# Patient Record
Sex: Male | Born: 1970 | Race: White | Hispanic: No | Marital: Married | Smoking: Former smoker
Health system: Southern US, Community
[De-identification: ages and names within clinical notes are randomized; demographics above are authoritative.]

## PROBLEM LIST (undated history)

## (undated) DIAGNOSIS — F419 Anxiety disorder, unspecified: Secondary | ICD-10-CM

## (undated) HISTORY — PX: HERNIA REPAIR: SHX51

## (undated) HISTORY — DX: Anxiety disorder, unspecified: F41.9

---

## 2015-11-29 ENCOUNTER — Emergency Department
Admission: EM | Admit: 2015-11-29 | Discharge: 2015-11-29 | Disposition: A | Discharge status: Home / Self Care | Payer: Self-pay | Attending: Emergency Medicine | Admitting: Emergency Medicine

## 2015-11-29 DIAGNOSIS — Y9289 Other specified places as the place of occurrence of the external cause: Secondary | ICD-10-CM | POA: Insufficient documentation

## 2015-11-29 DIAGNOSIS — S0502XA Injury of conjunctiva and corneal abrasion without foreign body, left eye, initial encounter: Secondary | ICD-10-CM | POA: Insufficient documentation

## 2015-11-29 DIAGNOSIS — X58XXXA Exposure to other specified factors, initial encounter: Secondary | ICD-10-CM | POA: Insufficient documentation

## 2015-11-29 DIAGNOSIS — Y9389 Activity, other specified: Secondary | ICD-10-CM | POA: Insufficient documentation

## 2015-11-29 DIAGNOSIS — Y990 Civilian activity done for income or pay: Secondary | ICD-10-CM | POA: Insufficient documentation

## 2015-11-29 DIAGNOSIS — Z87891 Personal history of nicotine dependence: Secondary | ICD-10-CM | POA: Insufficient documentation

## 2015-11-29 MED ORDER — EYE WASH OPHTH SOLN
OPHTHALMIC | Status: AC
  Filled 2015-11-29: qty 118

## 2015-11-29 MED ORDER — GENTAMICIN SULFATE 0.3 % OP SOLN: 1.0000 [drp] | OPHTHALMIC | Status: DC

## 2015-11-29 MED ORDER — FLUORESCEIN SODIUM 1 MG OP STRP
ORAL_STRIP | OPHTHALMIC | Status: AC
  Filled 2015-11-29: qty 1

## 2015-11-29 MED ORDER — TETRACAINE HCL 0.5 % OP SOLN
OPHTHALMIC | Status: AC
  Filled 2015-11-29: qty 2

## 2015-11-29 NOTE — Discharge Instructions (Signed)
Corneal Abrasion °The cornea is the clear covering at the front and center of the eye. When you look at the colored portion of the eye, you are looking through the cornea. It is a thin tissue made up of layers. The top layer is the most sensitive layer. A corneal abrasion happens if this layer is scratched or an injury causes it to come off.  °HOME CARE °· You may be given drops or a medicated cream. Use the medicine as told by your doctor. °· A pressure patch may be put over the eye. If this is done, follow your doctor's instructions for when to remove the patch. Do not drive or use machines while the eye patch is on. Judging distances is hard to do with a patch on. °· See your doctor for a follow-up exam if you are told to do so. It is very important that you keep this appointment. °GET HELP IF:  °· You have pain, are sensitive to light, and have a scratchy feeling in one eye or both eyes. °· Your pressure patch keeps getting loose. You can blink your eye under the patch. °· You have fluid coming from your eye or the lids stick together in the morning. °· You have the same symptoms in the morning that you did with the first abrasion. This could be days, weeks, or months after the first abrasion healed. °  °This information is not intended to replace advice given to you by your health care provider. Make sure you discuss any questions you have with your health care provider. °  °Document Released: 02/22/2008 Document Revised: 05/27/2015 Document Reviewed: 05/13/2013 °Elsevier Interactive Patient Education ©2016 [employer] ° °

## 2015-11-29 NOTE — ED Notes (Signed)
Visual acuity no corrective eyewear.  Left eye-20/30, right eye 20/25 Both 20/20

## 2015-11-29 NOTE — ED Notes (Signed)
Pt presents with c/o foreign body in left eye.  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

## 2015-11-29 NOTE — ED Notes (Signed)
Pt denies vision impairment

## 2015-11-29 NOTE — ED Provider Notes (Signed)
[facility] Emergency Department Provider Note  ____________________________________________  Time seen: Approximately 5:17 PM  I have reviewed the triage vital signs and the nursing notes.   HISTORY  Chief Complaint Foreign Body in Eye    HPI Cole Rose is a 45 y.o. male patient complaining of foreign body sensation left eye. Onset yesterday while working in his workshop. Patient state foreign body sensation which has not improved in the last 24 hours. Patient denies any vision disturbance. Patient states she tried to flush the eye at home and does not go to successfully remove the foreign body. Patient rates his pain as a 5/10.   History reviewed. No pertinent past medical history.  There are no active problems to display for this patient.   Past Surgical History  Procedure Laterality Date  . Hernia repair Right     inguinal    No current outpatient prescriptions on file.  Allergies Review of patient's allergies indicates no known allergies.  No family history on file.  Social History Social History  Substance Use Topics  . Smoking status: Former Games developermoker  . Smokeless tobacco: None  . Alcohol Use: None    Review of Systems Constitutional: No fever/chills Eyes: No visual changes. Foreign body sensation left eye ENT: No sore throat. Cardiovascular: Denies chest pain. Respiratory: Denies shortness of breath. Gastrointestinal: No abdominal pain.  No nausea, no vomiting.  No diarrhea.  No constipation. Genitourinary: Negative for dysuria. Musculoskeletal: Negative for back pain. Skin: Negative for rash. Neurological: Negative for headaches, focal weakness or numbness.  10-point ROS otherwise negative.  ____________________________________________   PHYSICAL EXAM:  VITAL SIGNS: ED Triage Vitals  Enc Vitals Group     BP 11/29/15 1706 143/75 mmHg     Pulse Rate 11/29/15 1706 90     Resp 11/29/15 1706 16     Temp 11/29/15 1706 98.2  F (36.8 C)     Temp Source 11/29/15 1706 Oral     SpO2 11/29/15 1706 100 %     Weight 11/29/15 1706 200 lb (90.719 kg)     Height --      Head Cir --      Peak Flow --      Pain Score --      Pain Loc --      Pain Edu? --      Excl. in GC? --     Constitutional: Alert and oriented. Well appearing and in no acute distress. Eyes: Conjunctivae are normal. PERRL. EOMI. fluoroscopy stain uptake reveals cornea abrasion. Head: Atraumatic. Nose: No congestion/rhinnorhea. Mouth/Throat: Mucous membranes are moist.  Oropharynx non-erythematous. Neck: No stridor.  No cervical spine tenderness to palpation. Hematological/Lymphatic/Immunilogical: No cervical lymphadenopathy. Cardiovascular: Normal rate, regular rhythm. Grossly normal heart sounds.  Good peripheral circulation. Respiratory: Normal respiratory effort.  No retractions. Lungs CTAB. Gastrointestinal: Soft and nontender. No distention. No abdominal bruits. No CVA tenderness. Musculoskeletal: No lower extremity tenderness nor edema.  No joint effusions. Neurologic:  Normal speech and language. No gross focal neurologic deficits are appreciated. No gait instability. Skin:  Skin is warm, dry and intact. No rash noted. Psychiatric: Mood and affect are normal. Speech and behavior are normal.  ____________________________________________   LABS (all labs ordered are listed, but only abnormal results are displayed)  Labs Reviewed - No data to display ____________________________________________  EKG   ____________________________________________  RADIOLOGY   ____________________________________________   PROCEDURES  Procedure(s) performed: None  Critical Care performed: No  ____________________________________________   INITIAL IMPRESSION /  ASSESSMENT AND PLAN / ED COURSE  Pertinent labs & imaging results that were available during my care of the patient were reviewed by me and considered in my medical decision  making (see chart for details).  Cornea abrasion left eye. Discussed discharge instructions with patient. Patient given a prescription for Garamycin eyedrops. She advised to follow-up with [facility] if no improvement in 2 days. Return back to ER if condition worsens. ____________________________________________   FINAL CLINICAL IMPRESSION(S) / ED DIAGNOSES  Final diagnoses:  Injury of conjunctiva and corneal abrasion of left eye w/o FB, initial encounter       Joni Reining, PA-C 11/29/15 1731  Emily Filbert, MD 11/29/15 1932

## 2019-11-21 ENCOUNTER — Encounter (HOSPITAL_COMMUNITY): Payer: Self-pay | Admitting: Emergency Medicine

## 2019-11-21 ENCOUNTER — Emergency Department (HOSPITAL_COMMUNITY)
Admission: EM | Admit: 2019-11-21 | Discharge: 2019-11-21 | Disposition: A | Discharge status: Home / Self Care | Payer: 59 | Attending: Emergency Medicine | Admitting: Emergency Medicine

## 2019-11-21 ENCOUNTER — Other Ambulatory Visit: Payer: Self-pay

## 2019-11-21 DIAGNOSIS — Z87891 Personal history of nicotine dependence: Secondary | ICD-10-CM | POA: Insufficient documentation

## 2019-11-21 DIAGNOSIS — R55 Syncope and collapse: Secondary | ICD-10-CM

## 2019-11-21 LAB — BASIC METABOLIC PANEL
Anion gap: 7 (ref 5–15)
BUN: 16 mg/dL (ref 6–20)
CO2: 25 mmol/L (ref 22–32)
Calcium: 9.2 mg/dL (ref 8.9–10.3)
Chloride: 104 mmol/L (ref 98–111)
Creatinine, Ser: 0.93 mg/dL (ref 0.61–1.24)
GFR calc Af Amer: >60 mL/min (ref >60)
GFR calc non Af Amer: >60 mL/min (ref >60)
Glucose, Bld: 104 mg/dL — ABNORMAL HIGH (ref 70–99)
Potassium: 3.8 mmol/L (ref 3.5–5.1)
Sodium: 136 mmol/L (ref 135–145)

## 2019-11-21 LAB — CBC
HCT: 48.4 % (ref 39.0–52.0)
Hemoglobin: 15.9 g/dL (ref 13.0–17.0)
MCH: 29.4 pg (ref 26.0–34.0)
MCHC: 32.9 g/dL (ref 30.0–36.0)
MCV: 89.5 fL (ref 80.0–100.0)
Platelets: 257 10*3/uL (ref 150–400)
RBC: 5.41 MIL/uL (ref 4.22–5.81)
RDW: 12.6 % (ref 11.5–15.5)
WBC: 11.4 10*3/uL — ABNORMAL HIGH (ref 4.0–10.5)
nRBC: 0 % (ref 0.0–0.2)

## 2019-11-21 LAB — URINALYSIS, ROUTINE W REFLEX MICROSCOPIC
Bilirubin Urine: NEGATIVE
Glucose, UA: NEGATIVE mg/dL
Hgb urine dipstick: NEGATIVE
Ketones, ur: NEGATIVE mg/dL
Leukocytes,Ua: NEGATIVE
Nitrite: NEGATIVE
Protein, ur: NEGATIVE mg/dL
Specific Gravity, Urine: 1.023 (ref 1.005–1.030)
pH: 5 (ref 5.0–8.0)

## 2019-11-21 LAB — TROPONIN I (HIGH SENSITIVITY): Troponin I (High Sensitivity): <2 ng/L (ref <18)

## 2019-11-21 LAB — CBG MONITORING, ED: Glucose-Capillary: 121 mg/dL — ABNORMAL HIGH (ref 70–99)

## 2019-11-21 NOTE — ED Triage Notes (Signed)
Patient was at work running heavy machinery and felt like he had two episode of near syncope without LOC.

## 2019-11-21 NOTE — ED Provider Notes (Signed)
[facility] EMERGENCY DEPARTMENT Provider Note   CSN: [id number] Arrival date & time: 11/21/19  1529     History Chief Complaint  Patient presents with  . Near Syncope    Cole Rose is a 49 y.o. male with a history of right inguinal hernia repair who presents to the emergency department with complaints of 2 episodes of feeling as though he may pass out this afternoon.  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  He returned to work and had a recurrent episode of the same type of symptoms, again relieved with getting out side and getting some fresh air.  No other alleviating or aggravating factors.  He did not syncopized.  He had no associated chest pain, dyspnea, emesis, headache, visual disturbance, numbness, or weakness. Denies leg pain/swelling, hemoptysis, recent surgery/trauma, recent long travel, hormone use, personal hx of cancer, or hx of DVT/PE.  He has not had similar symptoms in the past.  He denies issues with dehydration or feeling poorly otherwise throughout the day.  He states he feels back to baseline currently.   HPI     History reviewed. No pertinent past medical history.  There are no problems to display for this patient.   Past Surgical History:  Procedure Laterality Date  . HERNIA REPAIR Right    inguinal       History reviewed. No pertinent family history.  Social History   Tobacco Use  . Smoking status: Former Games developer  . Smokeless tobacco: Never Used  Substance Use Topics  . Alcohol use: Yes    Alcohol/week: 7.0 standard drinks    Types: 7 Shots of liquor per week    Comment:  a week  . Drug use: Not on file    Home Medications Prior to Admission medications   Medication Sig Start Date End  Date Taking? Authorizing Provider  gentamicin (GARAMYCIN) 0.3 % ophthalmic solution Place 1 drop into the left eye every 4 (four) hours. 11/29/15   Joni Reining, PA-C    Allergies    Patient has no known allergies.  Review of Systems   Review of Systems  Constitutional: Negative for chills and fever.  Eyes: Negative for visual disturbance.  Respiratory: Negative for shortness of breath.   Cardiovascular: Negative for chest pain and leg swelling.  Gastrointestinal: Negative for abdominal pain, nausea and vomiting.       Positive for "flutter in the abdomen", resolved at present.  Neurological: Positive for light-headedness (Resolved at present.). Negative for dizziness, seizures, syncope, facial asymmetry, speech difficulty, weakness, numbness and headaches.  All other systems reviewed and are negative.   Physical Exam Updated Vital Signs BP (!) 158/93 (BP Location: Right Arm)   Pulse 83   Temp 98.9 F (37.2 C) (Oral)   Resp 16   Ht 5\' 10"  (1.778 m)   Wt 93 kg   SpO2 100%   BMI 29.41 kg/m   Physical Exam Vitals and nursing note reviewed.  Constitutional:      General: He is not in acute distress.    Appearance: He is well-developed. He is not toxic-appearing.  HENT:     Head: Normocephalic and atraumatic.  Eyes:     General:  Right eye: No discharge.        Left eye: No discharge.     Conjunctiva/sclera: Conjunctivae normal.  Neck:     Vascular: No carotid bruit.  Cardiovascular:     Rate and Rhythm: Normal rate and regular rhythm.     Heart sounds: No murmur.  Pulmonary:     Effort: Pulmonary effort is normal. No respiratory distress.     Breath sounds: Normal breath sounds. No wheezing, rhonchi or rales.  Abdominal:     General: There is no distension.     Palpations: Abdomen is soft.     Tenderness: There is no abdominal tenderness. There is no guarding or rebound.  Musculoskeletal:     Cervical back: Neck supple.     Right lower leg: No edema.      Left lower leg: No edema.  Skin:    General: Skin is warm and dry.     Findings: No rash.  Neurological:     Mental Status: He is alert.     Comments: Clear speech.  CN II through XII grossly intact.  Sensation grossly intact bilateral upper and lower extremities.  5 out of 5 symmetric grip strength.  5 department the plantar dorsiflexion bilaterally.  Normal finger-to-nose.  Negative pronator drift.  Ambulatory with steady gait.  Psychiatric:        Behavior: Behavior normal.     ED Results / Procedures / Treatments   Labs (all labs ordered are listed, but only abnormal results are displayed) Labs Reviewed  BASIC METABOLIC PANEL - Abnormal; Notable for the following components:      Result Value   Glucose, Bld 104 (*)    All other components within normal limits  CBC - Abnormal; Notable for the following components:   WBC 11.4 (*)    All other components within normal limits  CBG MONITORING, ED - Abnormal; Notable for the following components:   Glucose-Capillary 121 (*)    All other components within normal limits  URINALYSIS, ROUTINE W REFLEX MICROSCOPIC  TROPONIN I (HIGH SENSITIVITY)  TROPONIN I (HIGH SENSITIVITY)    EKG EKG Interpretation  Date/Time:  Thursday November 21 2019 15:59:46 EST Ventricular Rate:  78 PR Interval:  174 QRS Duration: 74 QT Interval:  382 QTC Calculation: 435 R Axis:   -50 Text Interpretation: Normal sinus rhythm Left anterior fascicular block Cannot rule out Inferior infarct (masked by fascicular block?) , age undetermined Abnormal ECG No STEMI Confirmed by Alvester Chou [phone]) on 11/21/2019 4:51:26 PM   Radiology No results found.  Procedures Procedures (including critical care time)  19:40: Cardiac monitoring reveals NSR at a rate of 70 bpm as reviewed and interpreted by me. Cardiac monitoring was ordered due to near syncope and to monitor patient for dysrhythmia.  Medications Ordered in ED Medications - No data to display  ED  Course  I have reviewed the triage vital signs and the nursing notes.  Pertinent labs & imaging results that were available during my care of the patient were reviewed by me and considered in my medical decision making (see chart for details).  Clinical Course as of Nov 20 1809  Thu Nov 21, 2019  1807 Per my interpretation, ecg has NSR, no signs of ischemia, no evidence of heart block or prolonged Qtc, no evidence of right heart strain.   [MT]    Clinical Course User Index [MT] Trifan, Kermit Balo, MD   MDM Rules/Calculators/A&P  Patient presents to the emergency department status post 2 near syncopal episodes that occurred earlier this afternoon.  Patient is nontoxic, resting comfortably, vitals without significant abnormality, initially elevated blood pressure normalized during emergency department stay.  He has an overall benign physical exam with no focal neurologic deficits, murmurs, or peripheral edema. EKG without signs of ischemia, heart block, QT prolongation, or R heart strain. His labs do not show any anemia, electrolyte derangement, significant dehydration, or hypoglycemia.  Low risk heart pathway score, EKG no STEMI, troponin WNL, do not suspect ACS.  Patient is low risk Wells, PERC negative, doubt PE.  Cardiac monitor reviewed, no notable arrhythmias while in the emergency department.  Given the quick onset of his symptoms there is concern for possible underlying arrhythmia, we will have him follow-up closely with cardiology. I discussed results, treatment plan, need for follow-up, and return precautions with the patient. Provided opportunity for questions, patient confirmed understanding and is in agreement with plan.   Findings and plan of care discussed with supervising physician Dr. Langston Masker who is in agreement.   Final Clinical Impression(s) / ED Diagnoses Final diagnoses:  Near syncope    Rx / DC Orders ED Discharge Orders    None       Leafy Kindle 11/21/19 2043    Wyvonnia Dusky, MD 11/22/19 1329

## 2019-11-21 NOTE — Discharge Instructions (Addendum)
You were seen in the emergency department today after nearly passing out twice.  Your work-up was overall reassuring.  Your labs did not show any significant abnormalities.  Your electrolytes were all normal, your blood sugar was not low, you are not anemic.  Your EKG, heart monitoring, and heart enzymes were all reassuring, they did not show signs of an acute heart attack.  We are concerned that she may have had some kind of abnormal heart rhythm leading to your symptoms, for this reason we would like you to follow-up very closely with cardiology, please call the phone number in your discharge instructions for an appointment within 3 days.  Return to the emergency department for new or worsening symptoms including but not limited to passing out, chest pain, palpitations like your heart is beating fast or funny, trouble breathing, coughing up blood, numbness, weakness, change in your vision, headache, or any other concerns.

## 2019-11-27 ENCOUNTER — Other Ambulatory Visit: Payer: Self-pay

## 2019-11-27 ENCOUNTER — Encounter: Payer: Self-pay | Admitting: Nurse Practitioner

## 2019-11-27 ENCOUNTER — Ambulatory Visit (INDEPENDENT_AMBULATORY_CARE_PROVIDER_SITE_OTHER): Payer: 59 | Admitting: Nurse Practitioner

## 2019-11-27 VITALS — BP 130/82 | HR 65 | Temp 98.8°F | Resp 18 | Ht 71.0 in | Wt 200.0 lb

## 2019-11-27 DIAGNOSIS — K219 Gastro-esophageal reflux disease without esophagitis: Secondary | ICD-10-CM

## 2019-11-27 DIAGNOSIS — R0789 Other chest pain: Secondary | ICD-10-CM | POA: Diagnosis not present

## 2019-11-27 DIAGNOSIS — Z13228 Encounter for screening for other metabolic disorders: Secondary | ICD-10-CM | POA: Diagnosis not present

## 2019-11-27 DIAGNOSIS — R7309 Other abnormal glucose: Secondary | ICD-10-CM

## 2019-11-27 DIAGNOSIS — Z7689 Persons encountering health services in other specified circumstances: Secondary | ICD-10-CM

## 2019-11-27 DIAGNOSIS — D72829 Elevated white blood cell count, unspecified: Secondary | ICD-10-CM

## 2019-11-27 DIAGNOSIS — R42 Dizziness and giddiness: Secondary | ICD-10-CM

## 2019-11-27 DIAGNOSIS — Z8719 Personal history of other diseases of the digestive system: Secondary | ICD-10-CM

## 2019-11-27 DIAGNOSIS — Z13220 Encounter for screening for lipoid disorders: Secondary | ICD-10-CM

## 2019-11-27 HISTORY — DX: Personal history of other diseases of the digestive system: Z87.19

## 2019-11-27 LAB — URINALYSIS, ROUTINE W REFLEX MICROSCOPIC
Bilirubin Urine: NEGATIVE
Glucose, UA: NEGATIVE
Hgb urine dipstick: NEGATIVE
Ketones, ur: NEGATIVE
Leukocytes,Ua: NEGATIVE
Nitrite: NEGATIVE
Protein, ur: NEGATIVE
Specific Gravity, Urine: 1.02 (ref 1.001–1.03)
pH: 5 (ref 5.0–8.0)

## 2019-11-27 MED ORDER — PANTOPRAZOLE SODIUM 40 MG PO TBEC: 40.0000 mg | DELAYED_RELEASE_TABLET | Freq: Every day | ORAL | 3 refills | Status: DC

## 2019-11-27 NOTE — Progress Notes (Addendum)
New Patient Office Visit  Subjective:  Patient ID: Cole Rose, male    DOB: [birth year]  Age: 49 y.o. MRN: [id number]  CC:  Chief Complaint  Patient presents with  . Establish Care    NP  . Loss of Consciousness    hospt f/u    HPI Cole Rose is a 49 year old male presenting to establish care and ED follow up. Health history and medications discussed and reviewed.  Today No cp/ct, gu/gi sxs, pain, edema, sob, or falls.   ER visit 11/21/2019 with labs showing elevated glucose and wbc. Follow up labs today for establish care and trend.  As pt reports heart care apt at the end of month would like to move up. The pt reports that after ER visit he has had episodes of CT and feeling he was nervous and about to pass out. The sxs are not at the same time. No other sxs occur during or around episodes. He has tried no treatments.   Past Medical History:  Diagnosis Date  . Anxiety   . History of right inguinal hernia repair 11/27/2019    Past Surgical History:  Procedure Laterality Date  . HERNIA REPAIR Right    inguinal    No family history on file.  Social History   Socioeconomic History  . Marital status: Married    Spouse name: Not on file  . Number of children: Not on file  . Years of education: Not on file  . Highest education level: Not on file  Occupational History  . Not on file  Tobacco Use  . Smoking status: Former Games developer  . Smokeless tobacco: Never Used  Substance and Sexual Activity  . Alcohol use: Yes    Alcohol/week: 7.0 standard drinks    Types: 7 Shots of liquor per week    Comment:  a week  . Drug use: Not on file  . Sexual activity: Not on file  Other Topics Concern  . Not on file  Social History Narrative  . Not on file   Social Determinants of Health   Financial Resource Strain:   . Difficulty of Paying Living Expenses: Not on file  Food Insecurity:   . Worried About Programme researcher, broadcasting/film/video in the Last Year: Not on file  . Ran Out  of Food in the Last Year: Not on file  Transportation Needs:   . Lack of Transportation (Medical): Not on file  . Lack of Transportation (Non-Medical): Not on file  Physical Activity:   . Days of Exercise per Week: Not on file  . Minutes of Exercise per Session: Not on file  Stress:   . Feeling of Stress : Not on file  Social Connections:   . Frequency of Communication with Friends and Family: Not on file  . Frequency of Social Gatherings with Friends and Family: Not on file  . Attends Religious Services: Not on file  . Active Member of Clubs or Organizations: Not on file  . Attends Banker Meetings: Not on file  . Marital Status: Not on file  Intimate Partner Violence:   . Fear of Current or Ex-Partner: Not on file  . Emotionally Abused: Not on file  . Physically Abused: Not on file  . Sexually Abused: Not on file    ROS Review of Systems  All other systems reviewed and are negative.   Objective:   Today's Vitals: BP 130/82 (BP Location: Left Arm, Patient Position: Sitting, Cuff  Size: Large)   Pulse 65   Temp 98.8 F (37.1 C) (Oral)   Resp 18   Ht 5\' 11"  (1.803 m)   Wt 200 lb (90.7 kg)   SpO2 97%   BMI 27.89 kg/m   Physical Exam Vitals and nursing note reviewed.  Constitutional:      Appearance: Normal appearance. He is well-developed and well-groomed. He is not ill-appearing, toxic-appearing or diaphoretic.  HENT:     Head: Normocephalic.     Right Ear: Tympanic membrane, ear canal and external ear normal.     Left Ear: Tympanic membrane, ear canal and external ear normal.     Nose: Nose normal.     Mouth/Throat:     Lips: Pink.     Mouth: Mucous membranes are moist.     Pharynx: Oropharynx is clear.  Eyes:     General: Lids are normal. Lids are everted, no foreign bodies appreciated. No scleral icterus.    Extraocular Movements: Extraocular movements intact.     Right eye: Normal extraocular motion and no nystagmus.     Left eye: Normal  extraocular motion and no nystagmus.     Conjunctiva/sclera: Conjunctivae normal.     Pupils: Pupils are equal, round, and reactive to light.  Neck:     Thyroid: No thyroid mass, thyromegaly or thyroid tenderness.     Vascular: Normal carotid pulses. No carotid bruit, hepatojugular reflux or JVD.  Cardiovascular:     Rate and Rhythm: Normal rate and regular rhythm.     Pulses: Normal pulses.     Heart sounds: Normal heart sounds, S1 normal and S2 normal.  Pulmonary:     Effort: Pulmonary effort is normal. No respiratory distress.     Breath sounds: Normal breath sounds.  Chest:     Chest wall: No mass, swelling, tenderness or edema.  Abdominal:     General: Abdomen is flat. Bowel sounds are normal. There is no abdominal bruit.     Palpations: Abdomen is soft. There is no hepatomegaly, splenomegaly, mass or pulsatile mass.     Tenderness: There is no abdominal tenderness. There is no right CVA tenderness, left CVA tenderness or guarding.     Hernia: No hernia is present.  Musculoskeletal:     Cervical back: Full passive range of motion without pain, normal range of motion and neck supple.     Right lower leg: No edema.     Left lower leg: No edema.  Lymphadenopathy:     Head:     Right side of head: No submental, submandibular, tonsillar, preauricular, posterior auricular or occipital adenopathy.     Left side of head: No submental, submandibular, tonsillar, preauricular, posterior auricular or occipital adenopathy.     Cervical: No cervical adenopathy.  Skin:    General: Skin is warm and dry.     Capillary Refill: Capillary refill takes less than 2 seconds.     Coloration: Skin is not pale.  Neurological:     General: No focal deficit present.     Mental Status: He is alert and oriented to person, place, and time.     Cranial Nerves: Cranial nerves are intact.     Motor: Motor function is intact.     Coordination: Coordination is intact. Romberg sign negative. Coordination  normal.     Gait: Gait is intact.  Psychiatric:        Attention and Perception: Attention and perception normal.        Mood and  Affect: Mood and affect normal.        Speech: Speech normal.        Behavior: Behavior normal. Behavior is cooperative.        Thought Content: Thought content normal.        Cognition and Memory: Cognition and memory normal.        Judgment: Judgment normal.     Assessment & Plan:  Establish care today Lab results that are abnormal will be called to you with direction and normal will be available on Mychart Call 911 or go to ER for worsening or additional or change in sxs.  EKG sinus brady 58 rate today, referral to cardiology for full workup, carotid ultrasound ordered today, added PPI daily and pepcid at bedtime to rule out GERD as etiology of episodic sxs.  U/A pending results and repeat CBC to monitor the ER elevated WBC.   Follow low fat, cholesterol, carbohydrate, sugar, lean protein diet.  Drink plenty of water and get exercise  Problem List Items Addressed This Visit    None    Visit Diagnoses    Encounter to establish care with new doctor    -  Primary   Relevant Orders   Lipid panel   COMPLETE METABOLIC PANEL WITH GFR   CBC with Differential/Platelet   Urinalysis, Routine w reflex microscopic (Completed)   Screening, lipid       Relevant Orders   Lipid panel   Screening for metabolic disorder       Relevant Orders   COMPLETE METABOLIC PANEL WITH GFR   CBC with Differential/Platelet   Elevated glucose       Relevant Orders   Hemoglobin A1c   Leukocytosis, unspecified type       Relevant Orders   CBC with Differential/Platelet   Chest pain, unspecified type       Vertigo       Relevant Orders   US Carotid Duplex Bilateral   EKG 12-Lead   Ambulatory referral to Cardiology   Chest tightness       Relevant Orders   US Carotid Duplex Bilateral   EKG 12-Lead   Ambulatory referral to Cardiology   Gastroesophageal reflux disease,  unspecified whether esophagitis present       Relevant Medications   pantoprazole (PROTONIX) 40 MG tablet       Follow-up: Return if symptoms worsen or fail to improve, for and tentitive plan for health maintanance in 6 months with labs one week prior to appt.Elmore Guise, FNP

## 2019-11-28 LAB — LIPID PANEL
Cholesterol: 224 mg/dL — ABNORMAL HIGH (ref <200)
HDL: 57 mg/dL (ref >=40)
LDL Cholesterol (Calc): 152 mg/dL (calc) — ABNORMAL HIGH
Non-HDL Cholesterol (Calc): 167 mg/dL (calc) — ABNORMAL HIGH (ref <130)
Total CHOL/HDL Ratio: 3.9 (calc) (ref <5.0)
Triglycerides: 62 mg/dL (ref <150)

## 2019-11-28 LAB — CBC WITH DIFFERENTIAL/PLATELET
Absolute Monocytes: 770 cells/uL (ref 200–950)
Basophils Absolute: 50 cells/uL (ref 0–200)
Basophils Relative: 0.5 %
Eosinophils Absolute: 150 cells/uL (ref 15–500)
Eosinophils Relative: 1.5 %
HCT: 48.3 % (ref 38.5–50.0)
Hemoglobin: 16.4 g/dL (ref 13.2–17.1)
Lymphs Abs: 1910 cells/uL (ref 850–3900)
MCH: 29.5 pg (ref 27.0–33.0)
MCHC: 34 g/dL (ref 32.0–36.0)
MCV: 87 fL (ref 80.0–100.0)
MPV: 11 fL (ref 7.5–12.5)
Monocytes Relative: 7.7 %
Neutro Abs: 7120 cells/uL (ref 1500–7800)
Neutrophils Relative %: 71.2 %
Platelets: 297 10*3/uL (ref 140–400)
RBC: 5.55 10*6/uL (ref 4.20–5.80)
RDW: 12.3 % (ref 11.0–15.0)
Total Lymphocyte: 19.1 %
WBC: 10 10*3/uL (ref 3.8–10.8)

## 2019-11-28 LAB — COMPLETE METABOLIC PANEL WITH GFR
AG Ratio: 1.8 (calc) (ref 1.0–2.5)
ALT: 31 U/L (ref 9–46)
AST: 15 U/L (ref 10–40)
Albumin: 4.6 g/dL (ref 3.6–5.1)
Alkaline phosphatase (APISO): 67 U/L (ref 36–130)
BUN: 14 mg/dL (ref 7–25)
CO2: 30 mmol/L (ref 20–32)
Calcium: 10.5 mg/dL — ABNORMAL HIGH (ref 8.6–10.3)
Chloride: 99 mmol/L (ref 98–110)
Creat: 1.06 mg/dL (ref 0.60–1.35)
GFR, Est African American: 96 mL/min/{1.73_m2} (ref >=60)
GFR, Est Non African American: 83 mL/min/{1.73_m2} (ref >=60)
Globulin: 2.6 g/dL (calc) (ref 1.9–3.7)
Glucose, Bld: 99 mg/dL (ref 65–99)
Potassium: 4.3 mmol/L (ref 3.5–5.3)
Sodium: 138 mmol/L (ref 135–146)
Total Bilirubin: 0.5 mg/dL (ref 0.2–1.2)
Total Protein: 7.2 g/dL (ref 6.1–8.1)

## 2019-11-28 LAB — HEMOGLOBIN A1C
Hgb A1c MFr Bld: 5.2 % of total Hgb (ref <5.7)
Mean Plasma Glucose: 103 (calc)
eAG (mmol/L): 5.7 (calc)

## 2019-11-28 NOTE — Progress Notes (Signed)
Cholesterol elevated: I will send script for Statin to take daily.  He should follow up in 6 months with repeated labwork -APE (30-40 minute apt)  Not a diabetic  Calcium slightly elevated. Will monitor. Consider his diet as a possible contributor.   Make sure he has apt with cards (ask Carollee Herter status)  Did he complete carotid  ultrasound yet?

## 2019-11-29 ENCOUNTER — Emergency Department (HOSPITAL_COMMUNITY)
Admission: EM | Admit: 2019-11-29 | Discharge: 2019-11-29 | Disposition: A | Discharge status: Home / Self Care | Payer: 59 | Attending: Emergency Medicine | Admitting: Emergency Medicine

## 2019-11-29 ENCOUNTER — Emergency Department (HOSPITAL_COMMUNITY): Payer: 59

## 2019-11-29 ENCOUNTER — Telehealth: Payer: Self-pay

## 2019-11-29 ENCOUNTER — Other Ambulatory Visit: Payer: Self-pay

## 2019-11-29 ENCOUNTER — Encounter (HOSPITAL_COMMUNITY): Payer: Self-pay

## 2019-11-29 DIAGNOSIS — Z7982 Long term (current) use of aspirin: Secondary | ICD-10-CM | POA: Insufficient documentation

## 2019-11-29 DIAGNOSIS — R55 Syncope and collapse: Secondary | ICD-10-CM | POA: Diagnosis present

## 2019-11-29 DIAGNOSIS — Z87891 Personal history of nicotine dependence: Secondary | ICD-10-CM | POA: Diagnosis not present

## 2019-11-29 LAB — COMPREHENSIVE METABOLIC PANEL
ALT: 34 U/L (ref 0–44)
AST: 19 U/L (ref 15–41)
Albumin: 4.4 g/dL (ref 3.5–5.0)
Alkaline Phosphatase: 62 U/L (ref 38–126)
Anion gap: 7 (ref 5–15)
BUN: 16 mg/dL (ref 6–20)
CO2: 29 mmol/L (ref 22–32)
Calcium: 9.8 mg/dL (ref 8.9–10.3)
Chloride: 102 mmol/L (ref 98–111)
Creatinine, Ser: 1.01 mg/dL (ref 0.61–1.24)
GFR calc Af Amer: >60 mL/min (ref >60)
GFR calc non Af Amer: >60 mL/min (ref >60)
Glucose, Bld: 96 mg/dL (ref 70–99)
Potassium: 4.2 mmol/L (ref 3.5–5.1)
Sodium: 138 mmol/L (ref 135–145)
Total Bilirubin: 0.6 mg/dL (ref 0.3–1.2)
Total Protein: 7.9 g/dL (ref 6.5–8.1)

## 2019-11-29 LAB — CBC WITH DIFFERENTIAL/PLATELET
Abs Immature Granulocytes: 0.03 10*3/uL (ref 0.00–0.07)
Basophils Absolute: 0 10*3/uL (ref 0.0–0.1)
Basophils Relative: 0 %
Eosinophils Absolute: 0 10*3/uL (ref 0.0–0.5)
Eosinophils Relative: 0 %
HCT: 48.9 % (ref 39.0–52.0)
Hemoglobin: 16.2 g/dL (ref 13.0–17.0)
Immature Granulocytes: 0 %
Lymphocytes Relative: 10 %
Lymphs Abs: 1 10*3/uL (ref 0.7–4.0)
MCH: 29.2 pg (ref 26.0–34.0)
MCHC: 33.1 g/dL (ref 30.0–36.0)
MCV: 88.3 fL (ref 80.0–100.0)
Monocytes Absolute: 0.5 10*3/uL (ref 0.1–1.0)
Monocytes Relative: 4 %
Neutro Abs: 8.9 10*3/uL — ABNORMAL HIGH (ref 1.7–7.7)
Neutrophils Relative %: 86 %
Platelets: 285 10*3/uL (ref 150–400)
RBC: 5.54 MIL/uL (ref 4.22–5.81)
RDW: 12.1 % (ref 11.5–15.5)
WBC: 10.4 10*3/uL (ref 4.0–10.5)
nRBC: 0 % (ref 0.0–0.2)

## 2019-11-29 LAB — TROPONIN I (HIGH SENSITIVITY)
Troponin I (High Sensitivity): <2 ng/L (ref <18)
Troponin I (High Sensitivity): <2 ng/L (ref <18)

## 2019-11-29 IMAGING — DX DG CHEST 2V
2 series · 2 of 2 positions shown · non-contrast
Comparison: None.

CLINICAL DATA: Weakness. Near syncopal episode.

EXAM:
CHEST - 2 VIEW

[chest pa]
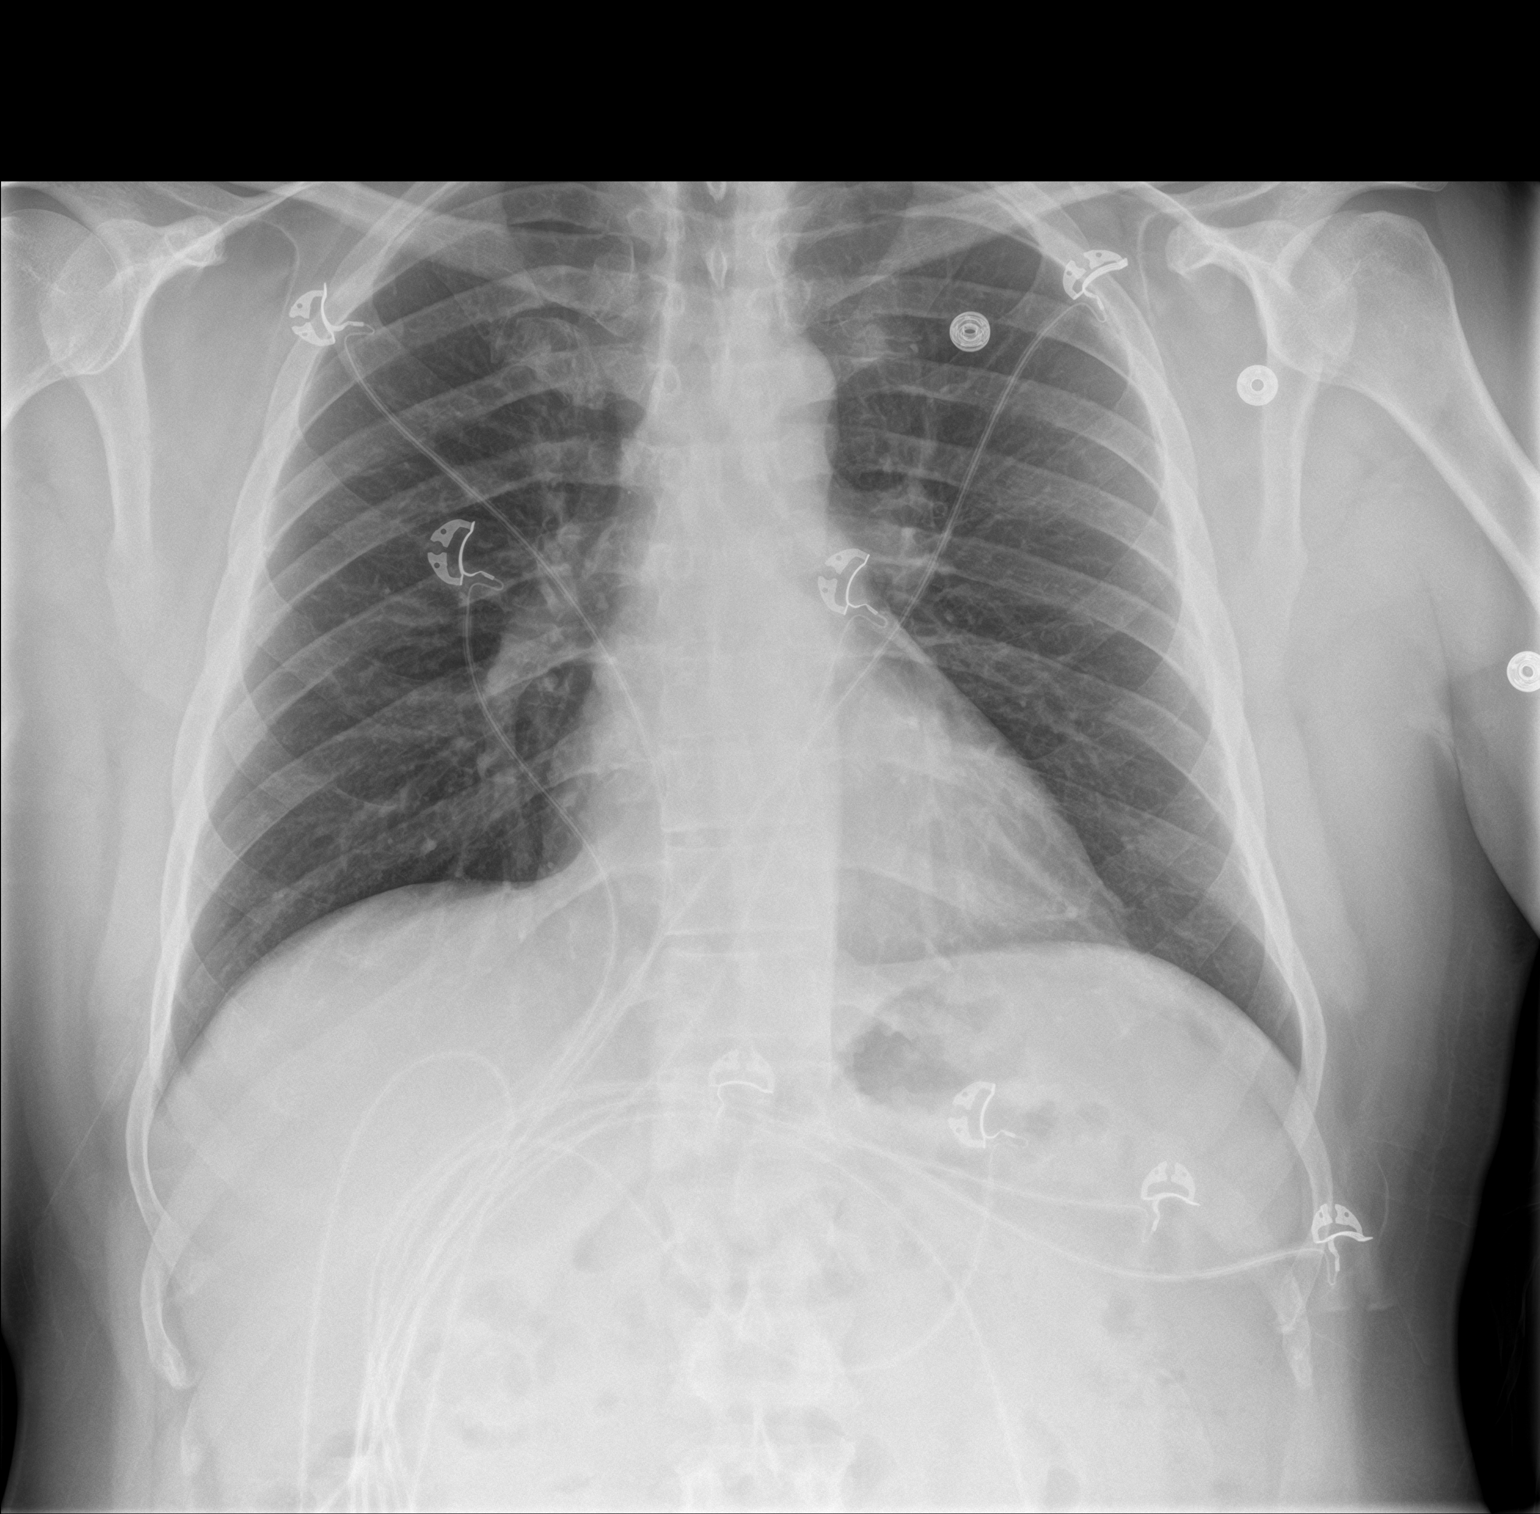

[chest lat]
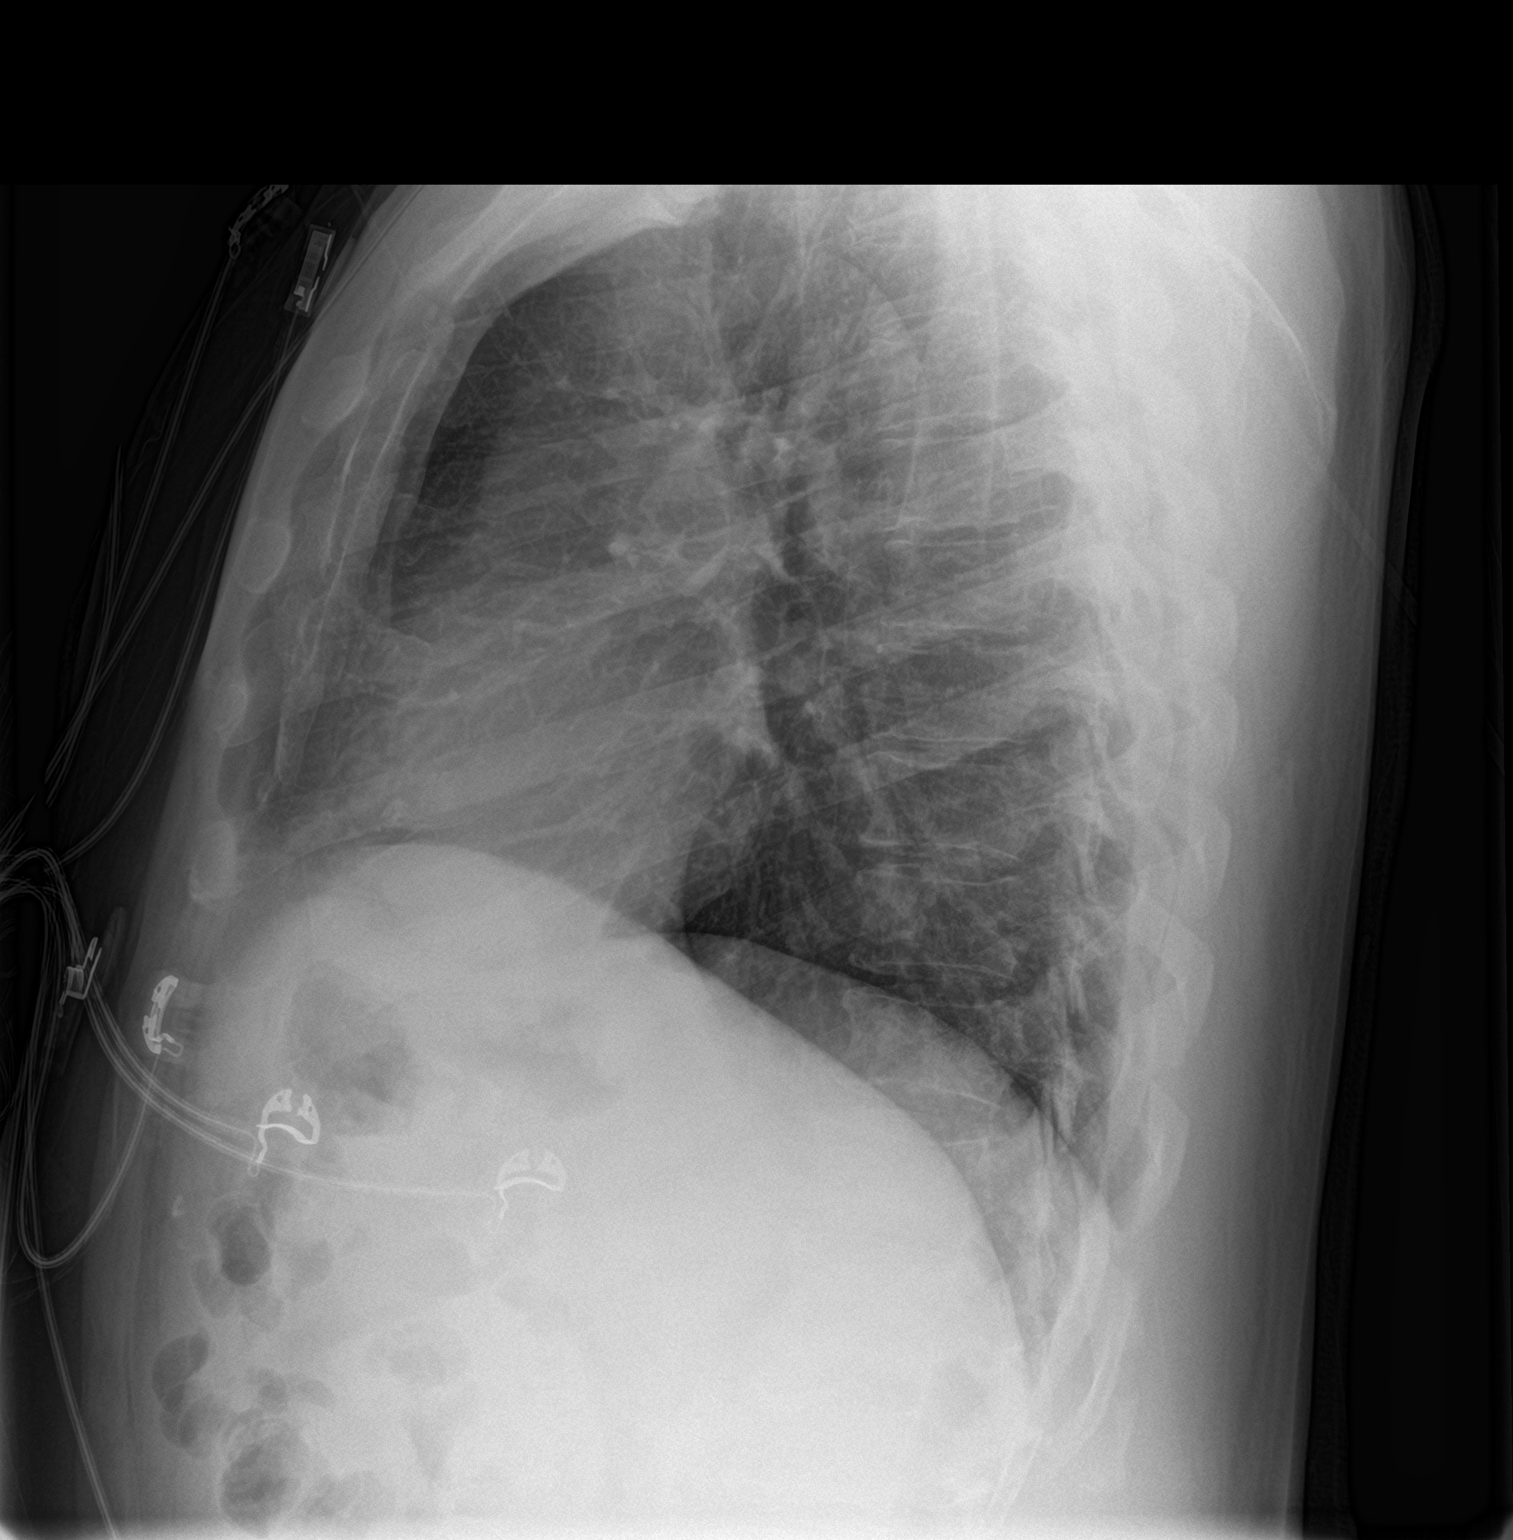

[2 of 2 positions shown; findings below may reference images not displayed]

FINDINGS: The cardiac silhouette, mediastinal and hilar contours are within
normal limits. The lungs are clear. No pleural effusions. The bony
thorax is intact.
IMPRESSION: No acute cardiopulmonary findings.

## 2019-11-29 MED ORDER — SODIUM CHLORIDE 0.9 % IV BOLUS
1000.0000 mL | Freq: Once | INTRAVENOUS | Status: AC
  Administered 2019-11-29: 1000 mL via INTRAVENOUS

## 2019-11-29 NOTE — ED Provider Notes (Signed)
[facility] EMERGENCY DEPARTMENT Provider Note   CSN: [id number] Arrival date & time: 11/29/19  6599     History Chief Complaint  Patient presents with  . Near Syncope    Cole Rose is a 49 y.o. male.  Patient complains of having some dizziness today at work and nearly passed out.  This happened once before 9 days ago.  The history is provided by the patient.  Near Syncope This is a new problem. The current episode started 3 to 5 hours ago. The problem occurs rarely. The problem has been resolved. Pertinent negatives include no chest pain, no abdominal pain and no headaches. Nothing aggravates the symptoms. Nothing relieves the symptoms. He has tried nothing for the symptoms. The treatment provided no relief.       Past Medical History:  Diagnosis Date  . Anxiety   . History of right inguinal hernia repair 11/27/2019    Patient Active Problem List   Diagnosis Date Noted  . History of right inguinal hernia repair 11/27/2019    Past Surgical History:  Procedure Laterality Date  . HERNIA REPAIR Right    inguinal       History reviewed. No pertinent family history.  Social History   Tobacco Use  . Smoking status: Former Research scientist (life sciences)  . Smokeless tobacco: Never Used  Substance Use Topics  . Alcohol use: Yes    Alcohol/week: 7.0 standard drinks    Types: 7 Standard drinks or equivalent per week    Comment:  a week, vodka tonic mixed drinks  . Drug use: Not on file    Home Medications Prior to Admission medications   Medication Sig Start Date End Date Taking? Authorizing Provider  acetaminophen (TYLENOL) 500 MG tablet Take 1,000 mg by mouth every 6 (six) hours as needed for headache.    Yes [provider]  aspirin 81 MG chewable tablet Chew 324 mg by mouth as needed.    Yes [provider]  pantoprazole (PROTONIX) 40 MG tablet Take 1 tablet (40 mg total) by mouth daily. 11/27/19  Yes Annie Main, FNP    Allergies    Patient has no  known allergies.  Review of Systems   Review of Systems  Constitutional: Negative for appetite change and fatigue.  HENT: Negative for congestion, ear discharge and sinus pressure.   Eyes: Negative for discharge.  Respiratory: Negative for cough.   Cardiovascular: Positive for near-syncope. Negative for chest pain.  Gastrointestinal: Negative for abdominal pain and diarrhea.  Genitourinary: Negative for frequency and hematuria.  Musculoskeletal: Negative for back pain.  Skin: Negative for rash.  Neurological: Negative for seizures and headaches.  Psychiatric/Behavioral: Negative for hallucinations.    Physical Exam Updated Vital Signs BP (!) 142/86   Pulse 64   Temp 98.8 F (37.1 C) (Oral)   Resp 18   Ht 5\' 11"  (1.803 m)   Wt 90 kg   SpO2 97%   BMI 27.67 kg/m   Physical Exam Vitals and nursing note reviewed.  Constitutional:      Appearance: He is well-developed.  HENT:     Head: Normocephalic.     Nose: Nose normal.  Eyes:     General: No scleral icterus.    Conjunctiva/sclera: Conjunctivae normal.  Neck:     Thyroid: No thyromegaly.  Cardiovascular:     Rate and Rhythm: Normal rate and regular rhythm.     Heart sounds: No murmur. No friction rub. No gallop.   Pulmonary:  Breath sounds: No stridor. No wheezing or rales.  Chest:     Chest wall: No tenderness.  Abdominal:     General: There is no distension.     Tenderness: There is no abdominal tenderness. There is no rebound.  Musculoskeletal:        General: Normal range of motion.     Cervical back: Neck supple.  Lymphadenopathy:     Cervical: No cervical adenopathy.  Skin:    Findings: No erythema or rash.  Neurological:     Mental Status: He is alert and oriented to person, place, and time.     Motor: No abnormal muscle tone.     Coordination: Coordination normal.  Psychiatric:        Behavior: Behavior normal.     ED Results / Procedures / Treatments   Labs (all labs ordered are listed,  but only abnormal results are displayed) Labs Reviewed  CBC WITH DIFFERENTIAL/PLATELET - Abnormal; Notable for the following components:      Result Value   Neutro Abs 8.9 (*)    All other components within normal limits  COMPREHENSIVE METABOLIC PANEL  TROPONIN I (HIGH SENSITIVITY)  TROPONIN I (HIGH SENSITIVITY)    EKG EKG Interpretation  Date/Time:  Friday November 29 2019 09:25:24 EST Ventricular Rate:  79 PR Interval:    QRS Duration: 81 QT Interval:  373 QTC Calculation: 428 R Axis:   55 Text Interpretation: Sinus rhythm Confirmed by Bethann Berkshire [phone]) on 11/29/2019 2:08:36 PM   Radiology DG Chest 2 View  Result Date: 11/29/2019 CLINICAL DATA:  Weakness. Near syncopal episode. EXAM: CHEST - 2 VIEW COMPARISON:  None. FINDINGS: The cardiac silhouette, mediastinal and hilar contours are within normal limits. The lungs are clear. No pleural effusions. The bony thorax is intact. IMPRESSION: No acute cardiopulmonary findings. Electronically Signed   By: Rudie Meyer M.D.   On: 11/29/2019 12:55   CT Head Wo Contrast  Result Date: 11/29/2019 CLINICAL DATA:  Syncope, ataxia. EXAM: CT HEAD WITHOUT CONTRAST TECHNIQUE: Contiguous axial images were obtained from the base of the skull through the vertex without intravenous contrast. COMPARISON:  None. FINDINGS: Brain: No evidence of acute infarction, hemorrhage, hydrocephalus, extra-axial collection or mass lesion/mass effect. Vascular: No hyperdense vessel or unexpected calcification. Skull: Normal. Negative for fracture or focal lesion. Sinuses/Orbits: No acute finding. Other: None. IMPRESSION: Normal head CT. Electronically Signed   By: Lupita Raider M.D.   On: 11/29/2019 12:52    Procedures Procedures (including critical care time)  Medications Ordered in ED Medications  sodium chloride 0.9 % bolus 1,000 mL (0 mLs Intravenous Stopped 11/29/19 1314)    ED Course  I have reviewed the triage vital signs and the nursing  notes.  Pertinent labs & imaging results that were available during my care of the patient were reviewed by me and considered in my medical decision making (see chart for details).    MDM Rules/Calculators/A&P                     Labs including 2 troponins along with an EKG and chest x-ray and CT head were all unremarkable.  Patient was discharged home to follow-up with his family doctor for these dizzy episodes  Final Clinical Impression(s) / ED Diagnoses Final diagnoses:  Near syncope    Rx / DC Orders ED Discharge Orders    None       Bethann Berkshire, MD 12/03/19 1148

## 2019-11-29 NOTE — ED Triage Notes (Addendum)
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 States he had chest pain that resolved with 324 mg aspirin. Requesting evaluation. Pt was seen here 11/21/19 for similar episode. Pt denies LOC or injury.   Pt complaining of knot on xyphoid process area. States he wants this to be checked out.

## 2019-11-29 NOTE — Telephone Encounter (Signed)
Pt's wife called and stated that there is no statin at pt's pharmacy. Can you send it in? Please advise.

## 2019-11-29 NOTE — Discharge Instructions (Addendum)
Continue present medicines and follow-up with your doctor as planned

## 2019-11-30 ENCOUNTER — Other Ambulatory Visit: Payer: Self-pay

## 2019-11-30 ENCOUNTER — Emergency Department (HOSPITAL_COMMUNITY): Payer: 59

## 2019-11-30 ENCOUNTER — Encounter (HOSPITAL_COMMUNITY): Payer: Self-pay | Admitting: Emergency Medicine

## 2019-11-30 ENCOUNTER — Observation Stay (HOSPITAL_COMMUNITY)
Admission: EM | Admit: 2019-11-30 | Discharge: 2019-12-01 | Disposition: A | Discharge status: Home / Self Care | Payer: 59 | Attending: Cardiology | Admitting: Cardiology

## 2019-11-30 DIAGNOSIS — R55 Syncope and collapse: Secondary | ICD-10-CM

## 2019-11-30 DIAGNOSIS — R7303 Prediabetes: Secondary | ICD-10-CM | POA: Insufficient documentation

## 2019-11-30 DIAGNOSIS — Z87891 Personal history of nicotine dependence: Secondary | ICD-10-CM | POA: Insufficient documentation

## 2019-11-30 DIAGNOSIS — R0789 Other chest pain: Principal | ICD-10-CM | POA: Insufficient documentation

## 2019-11-30 DIAGNOSIS — K219 Gastro-esophageal reflux disease without esophagitis: Secondary | ICD-10-CM | POA: Insufficient documentation

## 2019-11-30 DIAGNOSIS — Z7982 Long term (current) use of aspirin: Secondary | ICD-10-CM | POA: Insufficient documentation

## 2019-11-30 DIAGNOSIS — I209 Angina pectoris, unspecified: Secondary | ICD-10-CM | POA: Diagnosis present

## 2019-11-30 DIAGNOSIS — R079 Chest pain, unspecified: Secondary | ICD-10-CM

## 2019-11-30 DIAGNOSIS — Z79899 Other long term (current) drug therapy: Secondary | ICD-10-CM | POA: Insufficient documentation

## 2019-11-30 DIAGNOSIS — R200 Anesthesia of skin: Secondary | ICD-10-CM | POA: Insufficient documentation

## 2019-11-30 DIAGNOSIS — I10 Essential (primary) hypertension: Secondary | ICD-10-CM | POA: Insufficient documentation

## 2019-11-30 DIAGNOSIS — E785 Hyperlipidemia, unspecified: Secondary | ICD-10-CM | POA: Insufficient documentation

## 2019-11-30 DIAGNOSIS — F1010 Alcohol abuse, uncomplicated: Secondary | ICD-10-CM | POA: Insufficient documentation

## 2019-11-30 DIAGNOSIS — R202 Paresthesia of skin: Secondary | ICD-10-CM | POA: Insufficient documentation

## 2019-11-30 DIAGNOSIS — R110 Nausea: Secondary | ICD-10-CM | POA: Insufficient documentation

## 2019-11-30 DIAGNOSIS — R0602 Shortness of breath: Secondary | ICD-10-CM | POA: Insufficient documentation

## 2019-11-30 DIAGNOSIS — Z20822 Contact with and (suspected) exposure to covid-19: Secondary | ICD-10-CM | POA: Insufficient documentation

## 2019-11-30 LAB — RAPID URINE DRUG SCREEN, HOSP PERFORMED
Amphetamines: NOT DETECTED
Barbiturates: NOT DETECTED
Benzodiazepines: NOT DETECTED
Cocaine: NOT DETECTED
Opiates: NOT DETECTED
Tetrahydrocannabinol: NOT DETECTED

## 2019-11-30 LAB — CBC
HCT: 51.4 % (ref 39.0–52.0)
Hemoglobin: 16.9 g/dL (ref 13.0–17.0)
MCH: 29.4 pg (ref 26.0–34.0)
MCHC: 32.9 g/dL (ref 30.0–36.0)
MCV: 89.4 fL (ref 80.0–100.0)
Platelets: 333 10*3/uL (ref 150–400)
RBC: 5.75 MIL/uL (ref 4.22–5.81)
RDW: 12.4 % (ref 11.5–15.5)
WBC: 10.4 10*3/uL (ref 4.0–10.5)
nRBC: 0 % (ref 0.0–0.2)

## 2019-11-30 LAB — BASIC METABOLIC PANEL
Anion gap: 11 (ref 5–15)
BUN: 12 mg/dL (ref 6–20)
CO2: 26 mmol/L (ref 22–32)
Calcium: 9.6 mg/dL (ref 8.9–10.3)
Chloride: 101 mmol/L (ref 98–111)
Creatinine, Ser: 1.06 mg/dL (ref 0.61–1.24)
GFR calc Af Amer: >60 mL/min (ref >60)
GFR calc non Af Amer: >60 mL/min (ref >60)
Glucose, Bld: 104 mg/dL — ABNORMAL HIGH (ref 70–99)
Potassium: 3.9 mmol/L (ref 3.5–5.1)
Sodium: 138 mmol/L (ref 135–145)

## 2019-11-30 LAB — TROPONIN I (HIGH SENSITIVITY)
Troponin I (High Sensitivity): 7 ng/L (ref <18)
Troponin I (High Sensitivity): 6 ng/L (ref <18)
Troponin I (High Sensitivity): <2 ng/L (ref <18)
Troponin I (High Sensitivity): 7 ng/L (ref <18)

## 2019-11-30 LAB — D-DIMER, QUANTITATIVE: D-Dimer, Quant: <0.27 ug/mL-FEU (ref 0.00–0.50)

## 2019-11-30 LAB — HIV ANTIBODY (ROUTINE TESTING W REFLEX): HIV Screen 4th Generation wRfx: NONREACTIVE

## 2019-11-30 MED ORDER — ALPRAZOLAM 0.25 MG PO TABS: 0.2500 mg | ORAL_TABLET | Freq: Two times a day (BID) | ORAL | Status: DC | PRN

## 2019-11-30 MED ORDER — PANTOPRAZOLE SODIUM 40 MG PO TBEC
40.0000 mg | DELAYED_RELEASE_TABLET | Freq: Every day | ORAL | Status: DC
  Administered 2019-12-01: 40 mg via ORAL
  Filled 2019-11-30 (×2): qty 1

## 2019-11-30 MED ORDER — ASPIRIN EC 81 MG PO TBEC
81.0000 mg | DELAYED_RELEASE_TABLET | Freq: Every day | ORAL | Status: DC
  Administered 2019-12-01: 81 mg via ORAL
  Filled 2019-11-30: qty 1

## 2019-11-30 MED ORDER — SODIUM CHLORIDE 0.9% FLUSH: 3.0000 mL | Freq: Once | INTRAVENOUS | Status: DC

## 2019-11-30 MED ORDER — ASPIRIN 81 MG PO CHEW
324.0000 mg | CHEWABLE_TABLET | ORAL | Status: DC
  Filled 2019-11-30: qty 4

## 2019-11-30 MED ORDER — ASPIRIN 300 MG RE SUPP: 300.0000 mg | RECTAL | Status: DC

## 2019-11-30 MED ORDER — ATORVASTATIN CALCIUM 80 MG PO TABS
80.0000 mg | ORAL_TABLET | Freq: Every day | ORAL | Status: DC
  Administered 2019-11-30: 20:00:00 80 mg via ORAL
  Filled 2019-11-30: qty 1

## 2019-11-30 MED ORDER — METOPROLOL TARTRATE 12.5 MG HALF TABLET
12.5000 mg | ORAL_TABLET | Freq: Two times a day (BID) | ORAL | Status: DC
  Filled 2019-11-30: qty 1

## 2019-11-30 MED ORDER — NITROGLYCERIN 2 % TD OINT
0.5000 [in_us] | TOPICAL_OINTMENT | Freq: Three times a day (TID) | TRANSDERMAL | Status: DC
  Administered 2019-11-30: 0.5 [in_us] via TOPICAL
  Filled 2019-11-30: qty 30
  Filled 2019-11-30: qty 1

## 2019-11-30 MED ORDER — ONDANSETRON HCL 4 MG/2ML IJ SOLN: 4.0000 mg | Freq: Four times a day (QID) | INTRAMUSCULAR | Status: DC | PRN

## 2019-11-30 MED ORDER — OXYCODONE-ACETAMINOPHEN 5-325 MG PO TABS
1.0000 | ORAL_TABLET | Freq: Four times a day (QID) | ORAL | Status: DC | PRN
  Administered 2019-11-30: 1 via ORAL
  Filled 2019-11-30: qty 1

## 2019-11-30 MED ORDER — ENOXAPARIN SODIUM 40 MG/0.4ML ~~LOC~~ SOLN
40.0000 mg | SUBCUTANEOUS | Status: DC
  Administered 2019-11-30: 40 mg via SUBCUTANEOUS
  Filled 2019-11-30: qty 0.4

## 2019-11-30 MED ORDER — NITROGLYCERIN 0.4 MG SL SUBL: 0.4000 mg | SUBLINGUAL_TABLET | SUBLINGUAL | Status: DC | PRN

## 2019-11-30 MED ORDER — ACETAMINOPHEN 500 MG PO TABS
1000.0000 mg | ORAL_TABLET | Freq: Four times a day (QID) | ORAL | Status: DC | PRN
  Administered 2019-11-30: 1000 mg via ORAL
  Filled 2019-11-30: qty 2

## 2019-11-30 MED ORDER — SODIUM CHLORIDE 0.9 % IV SOLN: INTRAVENOUS | Status: DC

## 2019-11-30 NOTE — H&P (Signed)
Cole Rose is an 49 y.o. male.   Chief Complaint: Recurrent chest pain associate with dizziness and near syncopal episode HPI: Patient is 49 year old male with past medical history significant for recently diagnosed hyperlipidemia, glucose intolerance, mildly elevated blood pressure readings, positive family history of coronary artery disease grandfather had MIs in his 35s, came to ER complaining of recurrent episodes of retrosternal chest pain localized associated with feeling dizzy and felt like passing out.  Patient denies any palpitations.  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  Patient was seen by family medicine and was prescribed statin and Protonix for hyperlipidemia and possible GERD.  Denies any cardiac work-up in the past.  EKG done in the ED showed no acute ischemic changes 2 sets of high-sensitivity troponin I has been negative  Past Medical History:  Diagnosis Date  . Anxiety   . History of right inguinal hernia repair 11/27/2019    Past Surgical History:  Procedure Laterality Date  . HERNIA REPAIR Right    inguinal    No family history on file. Social History:  reports that he has quit smoking. He has never used smokeless tobacco. He reports current alcohol use of about 7.0 standard drinks of alcohol per week. No history on file for drug.  Allergies: No Known Allergies  (Not in a hospital admission)   Results for orders placed or performed during the hospital encounter of 11/30/19 (from the past 48 hour(s))  Basic metabolic panel     Status: Abnormal   Collection Time: 11/30/19 12:22 PM  Result Value Ref Range   Sodium 138 135 - 145  mmol/L   Potassium 3.9 3.5 - 5.1 mmol/L   Chloride 101 98 - 111 mmol/L   CO2 26 22 - 32 mmol/L   Glucose, Bld 104 (H) 70 - 99 mg/dL    Comment: Glucose reference range applies only to samples taken after fasting for at least 8 hours.   BUN 12 6 - 20 mg/dL   Creatinine, Ser 1.61 0.61 - 1.24 mg/dL   Calcium 9.6 8.9 - 09.6 mg/dL   GFR calc non Af Amer >60 >60 mL/min   GFR calc Af Amer >60 >60 mL/min   Anion gap 11 5 - 15    Comment: Performed at [facility], [address] [place], [place] [address]  CBC     Status: None   Collection Time: 11/30/19 12:22 PM  Result Value Ref Range   WBC 10.4 4.0 - 10.5 K/uL   RBC 5.75 4.22 - 5.81 MIL/uL   Hemoglobin 16.9 13.0 - 17.0 g/dL   HCT 98.1 19.1 - 47.8 %   MCV 89.4 80.0 - 100.0 fL   MCH 29.4 26.0 - 34.0 pg   MCHC 32.9 30.0 - 36.0 g/dL   RDW 29.5 62.1 - 30.8 %   Platelets 333 150 - 400 K/uL   nRBC 0.0 0.0 - 0.2 %    Comment: Performed at [facility], [address], [place], [place] [address]  Troponin I (High Sensitivity)     Status: None   Collection Time: 11/30/19 12:22 PM  Result Value Ref Range   Troponin I (High  Sensitivity) 7 <18 ng/L    Comment: (NOTE) Elevated high sensitivity troponin I (hsTnI) values and significant  changes across serial measurements may suggest ACS but many other  chronic and acute conditions are known to elevate hsTnI results.  Refer to the "Links" section for chest pain algorithms and additional  guidance. Performed at [facility], [place] [address] [place], [place] [address]   Rapid urine drug screen (hospital performed)     Status: None   Collection Time: 11/30/19  2:20 PM  Result Value Ref Range   Opiates NONE DETECTED NONE DETECTED   Cocaine NONE DETECTED NONE DETECTED   Benzodiazepines NONE DETECTED NONE DETECTED   Amphetamines NONE DETECTED NONE DETECTED   Tetrahydrocannabinol NONE DETECTED NONE DETECTED   Barbiturates NONE DETECTED NONE DETECTED    Comment: (NOTE) DRUG  SCREEN FOR MEDICAL PURPOSES ONLY.  IF CONFIRMATION IS NEEDED FOR ANY PURPOSE, NOTIFY LAB WITHIN 5 DAYS. LOWEST DETECTABLE LIMITS FOR URINE DRUG SCREEN Drug Class                     Cutoff (ng/mL) Amphetamine and metabolites    1000 Barbiturate and metabolites    200 Benzodiazepine                 182 Tricyclics and metabolites     300 Opiates and metabolites        300 Cocaine and metabolites        300 THC                            50 Performed at [facility], [place] [address] [place], [place] [address]   Troponin I (High Sensitivity)     Status: None   Collection Time: 11/30/19  2:47 PM  Result Value Ref Range   Troponin I (High Sensitivity) 6 <18 ng/L    Comment: (NOTE) Elevated high sensitivity troponin I (hsTnI) values and significant  changes across serial measurements may suggest ACS but many other  chronic and acute conditions are known to elevate hsTnI results.  Refer to the "Links" section for chest pain algorithms and additional  guidance. Performed at [facility], [place] [address] [place], [place] [address]   D-dimer, quantitative (not at [facility])     Status: None   Collection Time: 11/30/19  2:47 PM  Result Value Ref Range   D-Dimer, Quant <0.27 0.00 - 0.50 ug/mL-FEU    Comment: (NOTE) At the manufacturer cut-off of 0.50 ug/mL FEU, this assay has been documented to exclude PE with a sensitivity and negative predictive value of 97 to 99%.  At this time, this assay has not been approved by the FDA to exclude DVT/VTE. Results should be correlated with clinical presentation. Performed at [facility], [place] [address] [place], [place] [address]    DG Chest 2 View  Result Date: 11/30/2019 CLINICAL DATA:  Chest pain EXAM: CHEST - 2 VIEW COMPARISON:  11/29/2019 FINDINGS: The heart size and mediastinal contours are within normal limits. Both lungs are clear. The visualized skeletal structures are unremarkable. IMPRESSION: No acute abnormality of the  lungs. Electronically Signed   By: Eddie Candle M.D.   On: 11/30/2019 12:31   DG Chest 2 View  Result Date: 11/29/2019 CLINICAL DATA:  Weakness. Near syncopal episode. EXAM: CHEST - 2 VIEW COMPARISON:  None. FINDINGS: The cardiac silhouette, mediastinal and hilar contours are within normal limits.  The lungs are clear. No pleural effusions. The bony thorax is intact. IMPRESSION: No acute cardiopulmonary findings. Electronically Signed   By: Rudie Meyer M.D.   On: 11/29/2019 12:55   CT Head Wo Contrast  Result Date: 11/29/2019 CLINICAL DATA:  Syncope, ataxia. EXAM: CT HEAD WITHOUT CONTRAST TECHNIQUE: Contiguous axial images were obtained from the base of the skull through the vertex without intravenous contrast. COMPARISON:  None. FINDINGS: Brain: No evidence of acute infarction, hemorrhage, hydrocephalus, extra-axial collection or mass lesion/mass effect. Vascular: No hyperdense vessel or unexpected calcification. Skull: Normal. Negative for fracture or focal lesion. Sinuses/Orbits: No acute finding. Other: None. IMPRESSION: Normal head CT. Electronically Signed   By: Lupita Raider M.D.   On: 11/29/2019 12:52    Review of Systems  Constitutional: Negative for diaphoresis, fatigue and fever.  HENT: Negative for congestion.   Eyes: Negative for discharge.  Respiratory: Negative for cough and shortness of breath.   Cardiovascular: Positive for chest pain. Negative for palpitations.  Gastrointestinal: Negative for abdominal pain.  Genitourinary: Negative for difficulty urinating.  Neurological: Positive for dizziness. Negative for seizures.    Blood pressure (!) 152/86, pulse 67, temperature 98.6 F (37 C), temperature source Oral, resp. rate (!) 24, SpO2 100 %. Physical Exam  Constitutional: He is oriented to person, place, and time.  HENT:  Head: Normocephalic and atraumatic.  Eyes: Pupils are equal, round, and reactive to light. Conjunctivae are normal. Left eye exhibits no discharge.  No scleral icterus.  Neck: No JVD present. No tracheal deviation present. No thyromegaly present.  Cardiovascular: Normal rate and regular rhythm. Exam reveals no gallop and no friction rub.  No murmur heard. Respiratory: Effort normal and breath sounds normal. No respiratory distress. He has no wheezes. He has no rales.  GI: Soft. Bowel sounds are normal. He exhibits no distension. There is no abdominal tenderness. There is no rebound.  Musculoskeletal:        General: No tenderness, deformity or edema.     Cervical back: Normal range of motion and neck supple.  Neurological: He is alert and oriented to person, place, and time.     Assessment/Plan Recurrent chest pain worrisome for angina TIMI score 1 rule out MI Elevated blood pressure rule out hypertension Prediabetic Hyperlipidemia Possible GERD Positive family history of coronary artery disease Plan As per orders Discussed with patient various options of treatment i.e. nuclear stress test versus coronary CT angio, its risk and benefits wants to proceed with coronary CT angio. Rinaldo Cloud, MD 11/30/2019, 4:53 PM

## 2019-11-30 NOTE — ED Notes (Signed)
IV team order placed for 18 g Iv needle placement . Available Lt AC has multiple bruises.

## 2019-11-30 NOTE — Progress Notes (Signed)
Received to 6E 05 via stretcher from ED. Oriented to unit/ room. Telemonitor verified. Instructed to call prn needs. Verbalized understanding.

## 2019-11-30 NOTE — ED Provider Notes (Signed)
[facility] EMERGENCY DEPARTMENT Provider Note   CSN: [id number] Arrival date & time: 11/30/19  1203     History Chief Complaint  Patient presents with  . Near Syncope  . Chest Pain    Cole Rose is a 49 y.o. male with no significant PMH who presents with near syncope and chest pain. 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 Also has had some numbness/tingling in the left pinky finger. The episode lasted for about 10 minutes and he sat down and it got better. This is his 3rd ED visit for similar symptoms. He was seen yesterday and on 3/4 at [facility]. Both work ups were unremarkable and he was advised to f/u with Cardiology. He has been having chest pain on and off for about 1 year. Sometimes it will be across his "breastbone" and sometimes just substernal. It feels like a squeezing. He denies fever, abdominal pain, vomiting, diaphoresis, leg swelling or calf pain.    HPI     Past Medical History:  Diagnosis Date  . Anxiety   . History of right inguinal hernia repair 11/27/2019    Patient Active Problem List   Diagnosis Date Noted  . History of right inguinal hernia repair 11/27/2019    Past Surgical History:  Procedure Laterality Date  . HERNIA REPAIR Right    inguinal       No family history on file.  Social History   Tobacco Use  . Smoking status: Former Games developer  . Smokeless tobacco: Never Used  Substance Use Topics  . Alcohol use: Yes    Alcohol/week: 7.0 standard drinks    Types: 7 Standard drinks or equivalent per week    Comment:  a week, vodka tonic mixed drinks  . Drug use: Not on file    Home Medications Prior to Admission medications   Medication Sig Start Date End Date Taking? Authorizing Provider  acetaminophen (TYLENOL) 500 MG tablet Take 1,000 mg by mouth every 6 (six) hours as needed for  headache.     [provider]  aspirin 81 MG chewable tablet Chew 324 mg by mouth as needed.     [provider]  pantoprazole (PROTONIX) 40 MG tablet Take 1 tablet (40 mg total) by mouth daily. 11/27/19   Elmore Guise, FNP    Allergies    Patient has no known allergies.  Review of Systems   Review of Systems  Constitutional: Negative for fever.  Respiratory: Positive for shortness of breath.   Cardiovascular: Positive for chest pain. Negative for palpitations and leg swelling.  Gastrointestinal: Positive for nausea. Negative for abdominal pain and vomiting.  Neurological: Positive for light-headedness. Negative for syncope.  All other systems reviewed and are negative.   Physical Exam Updated Vital Signs BP (!) 149/92   Pulse 71   Temp 98.6 F (37 C) (Oral)   Resp 20   SpO2 100%   Physical Exam Vitals and nursing note reviewed.  Constitutional:      General: He is not in acute distress.    Appearance: Normal appearance. He is well-developed. He is not ill-appearing.     Comments: Anxious. Cooperative. NAD  HENT:     Head: Normocephalic and atraumatic.  Eyes:     General: No scleral icterus.       Right eye: No discharge.  Left eye: No discharge.     Conjunctiva/sclera: Conjunctivae normal.     Pupils: Pupils are equal, round, and reactive to light.  Cardiovascular:     Rate and Rhythm: Normal rate and regular rhythm.  Pulmonary:     Effort: Pulmonary effort is normal. No respiratory distress.     Breath sounds: Normal breath sounds.  Abdominal:     General: There is no distension.     Palpations: Abdomen is soft.     Tenderness: There is no abdominal tenderness.  Musculoskeletal:     Cervical back: Normal range of motion.     Right lower leg: No edema.     Left lower leg: No edema.  Skin:    General: Skin is warm and dry.  Neurological:     Mental Status: He is alert and oriented to person, place, and time.  Psychiatric:         Behavior: Behavior normal.     ED Results / Procedures / Treatments   Labs (all labs ordered are listed, but only abnormal results are displayed) Labs Reviewed  BASIC METABOLIC PANEL - Abnormal; Notable for the following components:      Result Value   Glucose, Bld 104 (*)    All other components within normal limits  SARS CORONAVIRUS 2 (TAT 6-24 HRS)  CBC  D-DIMER, QUANTITATIVE (NOT AT [facility])  RAPID URINE DRUG SCREEN, HOSP PERFORMED  HIV ANTIBODY (ROUTINE TESTING W REFLEX)  BASIC METABOLIC PANEL  CBC  TROPONIN I (HIGH SENSITIVITY)  TROPONIN I (HIGH SENSITIVITY)  TROPONIN I (HIGH SENSITIVITY)  TROPONIN I (HIGH SENSITIVITY)    EKG None  Radiology DG Chest 2 View  Result Date: 11/30/2019 CLINICAL DATA:  Chest pain EXAM: CHEST - 2 VIEW COMPARISON:  11/29/2019 FINDINGS: The heart size and mediastinal contours are within normal limits. Both lungs are clear. The visualized skeletal structures are unremarkable. IMPRESSION: No acute abnormality of the lungs. Electronically Signed   By: Eddie Candle M.D.   On: 11/30/2019 12:31   DG Chest 2 View  Result Date: 11/29/2019 CLINICAL DATA:  Weakness. Near syncopal episode. EXAM: CHEST - 2 VIEW COMPARISON:  None. FINDINGS: The cardiac silhouette, mediastinal and hilar contours are within normal limits. The lungs are clear. No pleural effusions. The bony thorax is intact. IMPRESSION: No acute cardiopulmonary findings. Electronically Signed   By: Marijo Sanes M.D.   On: 11/29/2019 12:55   CT Head Wo Contrast  Result Date: 11/29/2019 CLINICAL DATA:  Syncope, ataxia. EXAM: CT HEAD WITHOUT CONTRAST TECHNIQUE: Contiguous axial images were obtained from the base of the skull through the vertex without intravenous contrast. COMPARISON:  None. FINDINGS: Brain: No evidence of acute infarction, hemorrhage, hydrocephalus, extra-axial collection or mass lesion/mass effect. Vascular: No hyperdense vessel or unexpected calcification. Skull: Normal. Negative  for fracture or focal lesion. Sinuses/Orbits: No acute finding. Other: None. IMPRESSION: Normal head CT. Electronically Signed   By: Marijo Conception M.D.   On: 11/29/2019 12:52    Procedures Procedures (including critical care time)  Medications Ordered in ED Medications  sodium chloride flush (NS) 0.9 % injection 3 mL (has no administration in time range)    ED Course  I have reviewed the triage vital signs and the nursing notes.  Pertinent labs & imaging results that were available during my care of the patient were reviewed by me and considered in my medical decision making (see chart for details).  49 year old male presents with near syncope and chest  pain. He is hypertensive at times but otherwise vitals are reassuring. He is anxious and cooperative. Heart is regular rate and rhythm. Lungs are CTA. Abdomen is soft, non-tender. Legs are non-edematous. EKG is NSR. Will obtain labs, trop, D-dimer, CXR  Labs are normal. Trop is 7 and 6. D-dimer is negative. CXR is negative. Orthostatics are negative. Pt is extremely anxious since this is recurrent and is now occurring with exertion. Discussed with Dr. Estell Harpin. Will discuss with Cardiology.  Spoke with Dr. Sharyn Lull who will admit pt.  MDM Rules/Calculators/A&P                      Final Clinical Impression(s) / ED Diagnoses Final diagnoses:  Near syncope  Chest pain, unspecified type    Rx / DC Orders ED Discharge Orders    None       Bethel Born, PA-C 12/01/19 1213    Bethann Berkshire, MD 12/03/19 1126

## 2019-11-30 NOTE — ED Notes (Signed)
6E unable to take report at this time.  

## 2019-11-30 NOTE — ED Triage Notes (Signed)
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  States he felt like he was going to pass out but didn't.  Unsure if he had chest pain during previous events.  Per chart, he was seen at [facility] for same yesterday and on 3/4.  Denies pain at present.

## 2019-12-01 ENCOUNTER — Observation Stay (HOSPITAL_COMMUNITY): Payer: 59

## 2019-12-01 DIAGNOSIS — R079 Chest pain, unspecified: Secondary | ICD-10-CM

## 2019-12-01 LAB — BASIC METABOLIC PANEL
Anion gap: 8 (ref 5–15)
BUN: 14 mg/dL (ref 6–20)
CO2: 27 mmol/L (ref 22–32)
Calcium: 9.3 mg/dL (ref 8.9–10.3)
Chloride: 104 mmol/L (ref 98–111)
Creatinine, Ser: 1.05 mg/dL (ref 0.61–1.24)
GFR calc Af Amer: >60 mL/min (ref >60)
GFR calc non Af Amer: >60 mL/min (ref >60)
Glucose, Bld: 90 mg/dL (ref 70–99)
Potassium: 3.7 mmol/L (ref 3.5–5.1)
Sodium: 139 mmol/L (ref 135–145)

## 2019-12-01 LAB — CBC
HCT: 43.7 % (ref 39.0–52.0)
Hemoglobin: 14.5 g/dL (ref 13.0–17.0)
MCH: 29.4 pg (ref 26.0–34.0)
MCHC: 33.2 g/dL (ref 30.0–36.0)
MCV: 88.5 fL (ref 80.0–100.0)
Platelets: 263 10*3/uL (ref 150–400)
RBC: 4.94 MIL/uL (ref 4.22–5.81)
RDW: 12.3 % (ref 11.5–15.5)
WBC: 10.2 10*3/uL (ref 4.0–10.5)
nRBC: 0 % (ref 0.0–0.2)

## 2019-12-01 LAB — SARS CORONAVIRUS 2 (TAT 6-24 HRS): SARS Coronavirus 2: NEGATIVE

## 2019-12-01 MED ORDER — ATORVASTATIN CALCIUM 80 MG PO TABS: 80.0000 mg | ORAL_TABLET | Freq: Every day | ORAL | 3 refills | Status: DC

## 2019-12-01 MED ORDER — IOHEXOL 350 MG/ML SOLN
80.0000 mL | Freq: Once | INTRAVENOUS | Status: AC | PRN
  Administered 2019-12-01: 80 mL via INTRAVENOUS

## 2019-12-01 MED ORDER — LOSARTAN POTASSIUM 25 MG PO TABS: 25.0000 mg | ORAL_TABLET | Freq: Every day | ORAL | 11 refills | Status: AC

## 2019-12-01 MED ORDER — NITROGLYCERIN 0.4 MG SL SUBL
SUBLINGUAL_TABLET | SUBLINGUAL | Status: AC
  Administered 2019-12-01: 0.8 mg via SUBLINGUAL
  Filled 2019-12-01: qty 2

## 2019-12-01 MED ORDER — NITROGLYCERIN 0.4 MG SL SUBL: 0.8000 mg | SUBLINGUAL_TABLET | Freq: Once | SUBLINGUAL | Status: AC

## 2019-12-01 NOTE — Plan of Care (Signed)

## 2019-12-01 NOTE — Discharge Summary (Signed)
FV#[id number]

## 2019-12-01 NOTE — Discharge Instructions (Signed)
Angina  Angina is extreme discomfort in the chest, neck, arm, jaw, or back. The discomfort is caused by a lack of blood in the middle layer of the heart wall (myocardium). There are four types of angina:  Stable angina. This is triggered by vigorous activity or exercise. It goes away when you rest or take angina medicine.  Unstable angina. This is a warning sign and can lead to a heart attack (acute coronary syndrome). This is a medical emergency. Symptoms come at rest and last a long time.  Microvascular angina. This affects the small coronary arteries. Symptoms include feeling tired and being short of breath.  Prinzmetal or variant angina. This is caused by a tightening (spasm) of the arteries that go to your heart. What are the causes? This condition is caused by atherosclerosis. This is the buildup of fat and cholesterol (plaque) in your arteries. The plaque may narrow or block the artery. Other causes of angina include:  Sudden tightening of the muscles of the arteries in the heart (coronary spasm).  Small artery disease (microvascular dysfunction).  Problems with any of your heart valves (heart valve disease).  A tear in an artery in your heart (coronary artery dissection).  Diseases of the heart muscle (cardiomyopathy), or other heart diseases. What increases the risk? You are more likely to develop this condition if you have:  High cholesterol.  High blood pressure (hypertension).  Diabetes.  A family history of heart disease.  An inactive (sedentary) lifestyle, or you do not exercise enough.  Depression.  Had radiation treatment to the left side of your chest. Other risk factors include:  Using tobacco.  Being obese.  Eating a diet high in saturated fats.  Being exposed to high stress or triggers of stress.  Using drugs, such as cocaine. Women have a greater risk for angina if:  They are older than 55.  They have gone through menopause (are  postmenopausal). What are the signs or symptoms? Common symptoms of this condition in both men and women may include:  Chest pain, which may: ? Feel like a crushing or squeezing in the chest, or like a tightness, pressure, fullness, or heaviness in the chest. ? Last for more than a few minutes at a time, or it may stop and come back (recur) over the course of a few minutes.  Pain in the neck, arm, jaw, or back.  Unexplained heartburn or indigestion.  Shortness of breath.  Nausea.  Sudden cold sweats. Women and people with diabetes may have unusual (atypical) symptoms, such as:  Fatigue.  Unexplained feelings of nervousness or anxiety.  Unexplained weakness.  Dizziness or fainting. How is this diagnosed? This condition may be diagnosed based on:  Your symptoms and medical history.  Electrocardiogram (ECG) to measure the electrical activity in your heart.  Blood tests.  Stress test to look for signs of blockage when your heart is stressed.  CT angiogram to examine your heart and the blood flow to it.  Coronary angiogram to check your coronary arteries for blockage. How is this treated? Angina may be treated with:  Medicines to: ? Prevent blood clots and heart attack. ? Relax blood vessels and improve blood flow to the heart (nitrates). ? Reduce blood pressure, improve the pumping action of the heart, and relax blood vessels that are spasming. ? Reduce cholesterol and help treat atherosclerosis.  A procedure to widen a narrowed or blocked coronary artery (angioplasty). A mesh tube may be placed in a coronary artery to   keep it open (coronary stenting).  Surgery to allow blood to go around a blocked artery (coronary artery bypass surgery). Follow these instructions at home: Medicines  Take over-the-counter and prescription medicines only as told by your health care provider.  Do not take the following medicines unless your health care provider approves: ? NSAIDs,  such as ibuprofen or naproxen. ? Vitamin supplements that contain vitamin A, vitamin E, or both. ? Hormone replacement therapy that contains estrogen with or without progestin. Eating and drinking   Eat a heart-healthy diet. This includes plenty of fresh fruits and vegetables, whole grains, low-fat (lean) protein, and low-fat dairy products.  Follow instructions from your health care provider about eating or drinking restrictions. Activity  Follow an exercise program approved by your health care provider.  Consider joining a cardiac rehabilitation program.  Take a break when you feel fatigued. Plan rest periods in your daily activities. Lifestyle   Do not use any products that contain nicotine or tobacco, such as cigarettes, e-cigarettes, and chewing tobacco. If you need help quitting, ask your health care provider.  If your health care provider says you can drink alcohol: ? Limit how much you use to:  0-1 drink a day for nonpregnant women.  0-2 drinks a day for men. ? Be aware of how much alcohol is in your drink. In the [place] one drink equals one 12 oz bottle of beer (355 mL), one 5 oz glass of wine (148 mL), or one 1 oz glass of hard liquor (44 mL). General instructions  Maintain a healthy weight.  Learn to manage stress.  Keep your vaccinations up to date. Get the flu (influenza) vaccine every year.  Talk to your health care provider if you feel depressed. Take a depression screening test to see if you are at risk for depression.  Work with your health care provider to manage other health conditions, such as hypertension or diabetes.  Keep all follow-up visits as told by your health care provider. This is important. Get help right away if:  You have pain in your chest, neck, arm, jaw, or back, and the pain: ? Lasts more than a few minutes. ? Is recurring. ? Is not relieved by taking medicines under the tongue (sublingual nitroglycerin). ? Increases in intensity or  frequency.  You have a lot of sweating without cause.  You have unexplained: ? Heartburn or indigestion. ? Shortness of breath or difficulty breathing. ? Nausea or vomiting. ? Fatigue. ? Feelings of nervousness or anxiety. ? Weakness.  You have sudden light-headedness or dizziness.  You faint. These symptoms may represent a serious problem that is an emergency. Do not wait to see if the symptoms will go away. Get medical help right away. Call your local emergency services (911 in the [place] Do not drive yourself to the hospital. Summary  Angina is extreme discomfort in the chest, neck, arm, jaw, or back that is caused by a lack of blood in the heart wall.  There are many symptoms of angina. They include chest pain, unexplained heartburn or indigestion, sudden cold sweats, and fatigue.  Angina may be treated with behavioral changes, medicine, or surgery.  Symptoms of angina may represent an emergency. Get medical help right away. Call your local emergency services (911 in the [place] Do not drive yourself to the hospital. This information is not intended to replace advice given to you by your health care provider. Make sure you discuss any questions you have with your health care provider.   Document Revised: 04/23/2018 Document Reviewed: 04/23/2018 Elsevier Patient Education  2020 Elsevier Inc.  

## 2019-12-02 ENCOUNTER — Other Ambulatory Visit: Payer: Self-pay | Admitting: Nurse Practitioner

## 2019-12-02 ENCOUNTER — Telehealth: Payer: Self-pay

## 2019-12-02 DIAGNOSIS — F419 Anxiety disorder, unspecified: Secondary | ICD-10-CM

## 2019-12-02 MED ORDER — ALPRAZOLAM 0.25 MG PO TABS: 0.2500 mg | ORAL_TABLET | Freq: Two times a day (BID) | ORAL | 0 refills | Status: DC | PRN

## 2019-12-02 NOTE — Telephone Encounter (Signed)
Sent to pharmacy 

## 2019-12-02 NOTE — Telephone Encounter (Signed)
Pt's wife called to report that pt had to go back to ED on Saturday in which they kept pt overnight for observation. Wife stated that the ED put pt on lipitor. Wife also states that when pt has these episodes that he starts shaking and becomes very anxious and she would like to know if you would write him a low dose Rx of Xanax. Please advise.

## 2019-12-02 NOTE — Discharge Summary (Signed)
NAME: JUVENCIO, VERDI ERIC MEDICAL RECORD ZO:[id number] ACCOUNT [id number] DATE OF BIRTH:August 24, 1971 FACILITY: [facility] LOCATION: [facility] PHYSICIAN:Jeilani Grupe Daivd Council, MD  DISCHARGE SUMMARY  DATE OF DISCHARGE:  12/01/2019  ADMITTING DIAGNOSES: 1.  Recurrent chest pain, worrisome for angina.  TIMI score of 1.  Rule out myocardial infarction. 2.  Elevated blood pressure, rule out hypertension. 3.  Prediabetic. 4.  Hyperlipidemia. 5.  Possible gastroesophageal reflux disease. 6.  Positive family history of coronary artery disease.  FINAL DIAGNOSES: 1.  Atypical chest pain, myocardial infarction ruled out, negative coronary CT angiogram.   2.  New onset hypertension. 3.  Prediabetic. 4.  Hyperlipidemia. 5.  Gastroesophageal reflux disease. 6.  Positive family history of coronary artery disease.  DISCHARGE HOME MEDICATIONS: 1.  Atorvastatin 80 mg 1 tablet daily. 2.  Losartan 25 mg daily. 3.  Tylenol 1000 mg every 6 hours as needed. 4.  Aspirin 81 mg 1 tablet daily. 5.  Multivitamin 1 tablet daily. 6.  Pantoprazole 40 mg daily.  DIET:  Low salt, low cholesterol, 1800 calories ADA diet.  ACTIVITY:  As tolerated.  FOLLOWUP:  With me in 1 week.  Follow up with PMD as scheduled.  CONDITION AT DISCHARGE:  Stable.  BRIEF HISTORY:  The patient is a 49 year old male with past medical history significant for recently diagnosed hyperlipidemia, glucose intolerance, mildly elevated blood pressure readings, positive family history of coronary artery disease.  Grandfather  had MI in his 4s.  He came to the ER complaining of recurrent episodes of retrosternal chest pain localized, associated with feeling dizzy and feeling like passing out.  The patient denies any palpitations.  Denies shortness of breath.  States chest  pain lasted approximately 10-15 minutes, relieved with rest.  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  Was seen at [facility] last Thursday and yesterday.  Workup was negative and was discharged home.  The patient was seen by [facility] and was prescribed statin and Protonix for hyperlipidemia and possible GERD.  The patient denies any cardiac workup in the past.   EKG done in the ED showed no acute ischemic changes.  High-sensitivity troponin I 2 sets were negative.  His TIMI score is 1.  PHYSICAL EXAMINATION: GENERAL:  He was alert, awake, oriented x3 in no acute distress. VITAL SIGNS:  Blood pressure was 152/86, pulse 67 and regular.  He was afebrile. HEENT:  Conjunctivae pink. NECK:  Supple, no JVD, no bruit. LUNGS:  Clear to auscultation without rhonchi or rales. CARDIOVASCULAR:  S1, S2 was normal.  There was no murmur or gallop. ABDOMEN:  Soft.  Bowel sounds are present, nontender. EXTREMITIES:  There is no clubbing, cyanosis or edema. NEUROLOGIC:  Grossly intact.  LABORATORY DATA:  Sodium was 138, potassium 3.9, glucose was 104, BUN 12, creatinine 1.06, 3 sets of high sensitivity troponin I were negative.  Cholesterol was 224, LDL 152, HDL 57.  Hemoglobin was 16.9, hematocrit 51.4, white count of 10.4.  The  patient had CT of the brain which was normal.  Chest x-ray showed no evidence of cardiopulmonary disease.  The patient had a coronary CT angio which showed a coronary calcium score of 0.  Normal coronary origins and right dominance.  No evidence of  coronary artery disease.  BRIEF HOSPITAL COURSE:  The patient was admitted to telemetry unit.  MI was ruled out by serial enzymes  and EKG.  The patient did not have any episodes of anginal chest pain during the hospital stay.  The patient underwent coronary CT angiography today  as before, which showed no evidence of coronary artery disease with a coronary calcium score of 0.  The patient will be discharged home on above medications and will be followed up by me in 1 week and PMD  as scheduled.  We will do a GI workup as  appropriate as outpatient if the patient continues to have recurrent chest pain.  JN/NUANCE D:12/01/2019 T:12/02/2019 JOB:[id number]

## 2019-12-03 ENCOUNTER — Other Ambulatory Visit: Payer: Self-pay | Admitting: Nurse Practitioner

## 2019-12-03 DIAGNOSIS — J9811 Atelectasis: Secondary | ICD-10-CM

## 2019-12-03 MED ORDER — ALBUTEROL SULFATE HFA 108 (90 BASE) MCG/ACT IN AERS: 2.0000 | INHALATION_SPRAY | Freq: Four times a day (QID) | RESPIRATORY_TRACT | 2 refills | Status: DC | PRN

## 2019-12-03 MED ORDER — FLUTICASONE-SALMETEROL 250-50 MCG/DOSE IN AEPB: 1.0000 | INHALATION_SPRAY | Freq: Two times a day (BID) | RESPIRATORY_TRACT | 2 refills | Status: DC

## 2019-12-03 NOTE — Progress Notes (Signed)
I received an CT of chest report from 12/02/2019 that I presume the ER ordered. The report does show Bilateral lower lobe atelectasis. All other portions are normal. I will prescribe inhalers and follow up in one month with CT and appt after. He should make an appt for both the CT and the apt one week after. Of course if his sxs that he was having worsen seek medical attention.

## 2019-12-05 ENCOUNTER — Other Ambulatory Visit: Payer: Self-pay | Admitting: Nurse Practitioner

## 2019-12-05 ENCOUNTER — Other Ambulatory Visit: Payer: Self-pay

## 2019-12-05 ENCOUNTER — Ambulatory Visit (HOSPITAL_COMMUNITY)
Admission: RE | Admit: 2019-12-05 | Discharge: 2019-12-05 | Disposition: A | Discharge status: Home / Self Care | Payer: 59 | Source: Ambulatory Visit | Attending: Nurse Practitioner | Admitting: Nurse Practitioner

## 2019-12-05 ENCOUNTER — Telehealth: Payer: Self-pay | Admitting: Nurse Practitioner

## 2019-12-05 ENCOUNTER — Encounter: Payer: Self-pay | Admitting: Nurse Practitioner

## 2019-12-05 DIAGNOSIS — R0789 Other chest pain: Secondary | ICD-10-CM | POA: Insufficient documentation

## 2019-12-05 DIAGNOSIS — R42 Dizziness and giddiness: Secondary | ICD-10-CM

## 2019-12-05 DIAGNOSIS — J9811 Atelectasis: Secondary | ICD-10-CM

## 2019-12-05 HISTORY — DX: Atelectasis: J98.11

## 2019-12-05 NOTE — Telephone Encounter (Signed)
Patient and wife came by he has filled out a DPR giving [place] permission to talk with his wife. She would like for Crystal to call and discuss results with her. She states that yesterday you weren't able to talk with her since he didn't have a DPR.  CB# [phone]

## 2019-12-09 NOTE — Progress Notes (Signed)
IMPRESSION: Mild bilateral intimal thickening with less than 50% stenosis of the bilateral ICAs.  As we discussed in clinic less than 50% not usually operable however he does have cardiology referral from our establish care visit.

## 2019-12-11 ENCOUNTER — Other Ambulatory Visit: Payer: Self-pay | Admitting: Nurse Practitioner

## 2019-12-11 ENCOUNTER — Telehealth: Payer: Self-pay | Admitting: Nurse Practitioner

## 2019-12-11 ENCOUNTER — Ambulatory Visit: Payer: 59 | Admitting: Cardiovascular Disease

## 2019-12-11 ENCOUNTER — Encounter: Payer: Self-pay | Admitting: Nurse Practitioner

## 2019-12-11 DIAGNOSIS — J9811 Atelectasis: Secondary | ICD-10-CM

## 2019-12-11 DIAGNOSIS — R140 Abdominal distension (gaseous): Secondary | ICD-10-CM

## 2019-12-11 DIAGNOSIS — R0789 Other chest pain: Secondary | ICD-10-CM

## 2019-12-11 DIAGNOSIS — K219 Gastro-esophageal reflux disease without esophagitis: Secondary | ICD-10-CM | POA: Insufficient documentation

## 2019-12-11 HISTORY — DX: Gastro-esophageal reflux disease without esophagitis: K21.9

## 2019-12-11 NOTE — Telephone Encounter (Signed)
Is he worse? Is there any improvements?  I started him on PPI on first visit to see if he had any improvement of his sxs not r/t having acid reflux, does he have any improvements? Certainly can refer to GI for etiology of sxs from GI but would need reason for referral. I have no notes from the Cardiologist and would like these records to keep me in the loop. I do not see records of the visit. He had an ER visit just after the establish care visit with me and they ordered a CT of chest which resulted to me. I started inhalers and repeat CT scan. Is his sxs worsening? Any improvements? Are they not wanting to see if the CT shows improvements with the inhalers? Directions on that telephone note and then on my phone call per their request with the pt and his wife was to let me know if sxs worsen as action would need to be take ie. Change the treatment plan.  The CT results is the only diagnostic image that revealed an issue so we should look there. This was explained on my call to pt and his wife.   If they would like to not follow the treatment plan in place for non worsening sxs or if sxs are worse then the directions then and is now is to make a follow up appt. if that is the case then I would go ahead and refer to Pulmonology to manage.   Please get some specifics.  Also, it is safest to have office visits for this type of treatment planning. Objective is only part of a medical examination and this is even third hand.   [employer] FNP-C

## 2019-12-11 NOTE — Telephone Encounter (Signed)
Spoke with patient's wife and she informed me that patient is doing better as far as acid reflux goes. She states that he continues to have a feeling of knot in the center of his chest, abdominal bloating, decreased appetite and shortness of breath when bending over. Advised her on treatment plan and she stated that she just wants him to go to GI. Advised her that we will need to see patient again in office, also advised her that we need to get notes from cardiologist. Patient wife verbalized understanding and office visit scheduled for tomorrow.

## 2019-12-11 NOTE — Telephone Encounter (Signed)
Patient's wife called in requesting a referral to Gastroenterology Dr. Sheppard Penton. She stated that the patient has been cleared by cardiology and that he had an appointment with Dr. Sharyn Lull in his office. Please advise?

## 2019-12-12 ENCOUNTER — Other Ambulatory Visit: Payer: Self-pay

## 2019-12-12 ENCOUNTER — Encounter: Payer: Self-pay | Admitting: Nurse Practitioner

## 2019-12-12 ENCOUNTER — Ambulatory Visit (INDEPENDENT_AMBULATORY_CARE_PROVIDER_SITE_OTHER): Payer: 59 | Admitting: Nurse Practitioner

## 2019-12-12 VITALS — BP 118/78 | HR 66 | Temp 97.6°F | Resp 18 | Ht 71.0 in | Wt 197.8 lb

## 2019-12-12 DIAGNOSIS — K5900 Constipation, unspecified: Secondary | ICD-10-CM | POA: Diagnosis not present

## 2019-12-12 DIAGNOSIS — Z09 Encounter for follow-up examination after completed treatment for conditions other than malignant neoplasm: Secondary | ICD-10-CM | POA: Diagnosis not present

## 2019-12-12 NOTE — Progress Notes (Signed)
Established Patient Office Visit  Subjective:  Patient ID: Cole Rose, male    DOB: [birth year]  Age: 49 y.o. MRN: [id number]  CC:  Chief Complaint  Patient presents with  . Referral    GI, protonix helped with reflux, still has sob when bending over, knot in chest, bloating, loss in appetite, same sxs    HPI Cole Rose is a 49 year old presenting for ED follow Up. He has new sxs of constipation. He did start daily PPI this month with bedtime Pepcid. He has not tried tx. Discussed considering Miralax and increasing fiber. No cp/ct, other gu/gi sxs, pains, sob, edema, palpation. He is concerned about his sxs this month that led him to 3 ER visits. His treatment plan is Pulm, Cards, and GI. Cardiologist has ruled out cardiac etiology. 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 Pt feels he may have hiatal hernia with him and his wife's research online that a hiatal hernia has moved into his lungs. LPn handling referral in office today contacted the GI referring office and was able to make apt in next two weeks. Discussed to continue GI and Pulmonary treatment plan as ordered and pt along with his wife are happy with the plan.  Past Medical History:  Diagnosis Date  . Anxiety   . Bilateral atelectasis 12/05/2019  . Gastroesophageal reflux disease 12/11/2019  . History of right inguinal hernia repair 11/27/2019    Past Surgical History:  Procedure Laterality Date  . HERNIA REPAIR Right    inguinal    No family history on file.  Social History   Socioeconomic History  . Marital status: Married    Spouse name: Not on file  . Number of children: Not on file  . Years of education: Not on file  . Highest education level: Not on file  Occupational History  . Not on file    Tobacco Use  . Smoking status: Former Games developer  . Smokeless tobacco: Never Used  Substance and Sexual Activity  . Alcohol use: Yes    Alcohol/week: 7.0 standard drinks    Types: 7 Standard drinks or equivalent per week    Comment:  a week, vodka tonic mixed drinks  . Drug use: Not on file  . Sexual activity: Not on file  Other Topics Concern  . Not on file  Social History Narrative  . Not on file   Social Determinants of Health   Financial Resource Strain:   . Difficulty of Paying Living Expenses:   Food Insecurity:   . Worried About Programme researcher, broadcasting/film/video in the Last Year:   . Barista in the Last Year:   Transportation Needs:   . Freight forwarder (Medical):   Marland Kitchen Lack of Transportation (Non-Medical):   Physical Activity:   . Days of Exercise per Week:   . Minutes of Exercise per Session:   Stress:   . Feeling of Stress :   Social Connections:   . Frequency of Communication with Friends and Family:   . Frequency of Social Gatherings with Friends and Family:   . Attends Religious Services:   . Active Member of Clubs or Organizations:   . Attends Banker Meetings:   Marland Kitchen Marital Status:  Intimate Partner Violence:   . Fear of Current or Ex-Partner:   . Emotionally Abused:   Marland Kitchen Physically Abused:   . Sexually Abused:     Outpatient Medications Prior to Visit  Medication Sig Dispense Refill  . acetaminophen (TYLENOL) 500 MG tablet Take 1,000 mg by mouth every 6 (six) hours as needed for headache.     . albuterol (VENTOLIN HFA) 108 (90 Base) MCG/ACT inhaler Inhale 2 puffs into the lungs every 6 (six) hours as needed for wheezing or shortness of breath. 8 g 2  . ALPRAZolam (XANAX) 0.25 MG tablet Take 1 tablet (0.25 mg total) by mouth 2 (two) times daily as needed for anxiety. 20 tablet 0  . aspirin 81 MG chewable tablet Chew 324 mg by mouth once.     Marland Kitchen atorvastatin (LIPITOR) 80 MG tablet Take 1 tablet (80 mg total) by mouth daily at 6 PM. 30 tablet 3  .  Fluticasone-Salmeterol (ADVAIR DISKUS) 250-50 MCG/DOSE AEPB Inhale 1 puff into the lungs 2 (two) times daily. 1 each 2  . losartan (COZAAR) 25 MG tablet Take 1 tablet (25 mg total) by mouth daily. 30 tablet 11  . Multiple Vitamin (MULTIVITAMIN WITH MINERALS) TABS tablet Take 1 tablet by mouth at bedtime.    . pantoprazole (PROTONIX) 40 MG tablet Take 1 tablet (40 mg total) by mouth daily. 30 tablet 3   No facility-administered medications prior to visit.    No Known Allergies  ROS Review of Systems  All other systems reviewed and are negative.     Objective:    Physical Exam  Constitutional: He is oriented to person, place, and time. Vital signs are normal. He appears well-developed and well-nourished. He does not have a sickly appearance. He does not appear ill. No distress.  HENT:  Head: Normocephalic.  Right Ear: Hearing and external ear normal.  Left Ear: Hearing and external ear normal.  Nose: Nose normal.  Mouth/Throat: Mucous membranes are normal.  Eyes: Pupils are equal, round, and reactive to light. Conjunctivae, EOM and lids are normal. Lids are everted and swept, no foreign bodies found.  Neck: No JVD present.  Cardiovascular: Normal rate.  Pulmonary/Chest: Effort normal. No accessory muscle usage. No respiratory distress. He has no wheezes.  Abdominal: Normal appearance.  Musculoskeletal:        General: Normal range of motion.     Cervical back: Normal range of motion and neck supple. No edema.  Neurological: He is alert and oriented to person, place, and time.  Skin: Skin is warm and dry. He is not diaphoretic.  Psychiatric: His speech is normal and behavior is normal. Judgment and thought content normal. His mood appears anxious. Cognition and memory are normal.  Nursing note and vitals reviewed.   BP 118/78 (BP Location: Left Arm, Patient Position: Sitting, Cuff Size: Normal)   Pulse 66   Temp 97.6 F (36.4 C) (Temporal)   Resp 18   Ht 5\' 11"  (1.803 m)    Wt 197 lb 12.8 oz (89.7 kg)   SpO2 96%   BMI 27.59 kg/m  Wt Readings from Last 3 Encounters:  12/12/19 197 lb 12.8 oz (89.7 kg)  11/29/19 198 lb 6.6 oz (90 kg)  11/27/19 200 lb (90.7 kg)     Health Maintenance Due  Topic Date Due  . TETANUS/TDAP  Never done  . INFLUENZA VACCINE  Never done    There are no preventive care reminders to display for this patient.  No results found for:  TSH Lab Results  Component Value Date   WBC 10.2 12/01/2019   HGB 14.5 12/01/2019   HCT 43.7 12/01/2019   MCV 88.5 12/01/2019   PLT 263 12/01/2019   Lab Results  Component Value Date   NA 139 12/01/2019   K 3.7 12/01/2019   CO2 27 12/01/2019   GLUCOSE 90 12/01/2019   BUN 14 12/01/2019   CREATININE 1.05 12/01/2019   BILITOT 0.6 11/29/2019   ALKPHOS 62 11/29/2019   AST 19 11/29/2019   ALT 34 11/29/2019   PROT 7.9 11/29/2019   ALBUMIN 4.4 11/29/2019   CALCIUM 9.3 12/01/2019   ANIONGAP 8 12/01/2019   Lab Results  Component Value Date   CHOL 224 (H) 11/27/2019   Lab Results  Component Value Date   HDL 57 11/27/2019   Lab Results  Component Value Date   LDLCALC 152 (H) 11/27/2019   Lab Results  Component Value Date   TRIG 62 11/27/2019   Lab Results  Component Value Date   CHOLHDL 3.9 11/27/2019   Lab Results  Component Value Date   HGBA1C 5.2 11/27/2019      Assessment & Plan:  ED follow up: stable GI, Pulmonary referral pending 01/08/2020 at 10:30 in the [facility] Pulmonary    Problem List Items Addressed This Visit    None    Visit Diagnoses    Follow up    -  Primary     Follow-up: As needed and as planned for health [place], [place]

## 2019-12-24 ENCOUNTER — Other Ambulatory Visit: Payer: Self-pay

## 2019-12-24 ENCOUNTER — Ambulatory Visit (INDEPENDENT_AMBULATORY_CARE_PROVIDER_SITE_OTHER): Payer: 59 | Admitting: Nurse Practitioner

## 2019-12-24 VITALS — BP 120/72 | HR 75 | Temp 98.1°F | Resp 18 | Ht 71.0 in | Wt 190.8 lb

## 2019-12-24 DIAGNOSIS — F329 Major depressive disorder, single episode, unspecified: Secondary | ICD-10-CM | POA: Diagnosis not present

## 2019-12-24 DIAGNOSIS — F419 Anxiety disorder, unspecified: Secondary | ICD-10-CM

## 2019-12-24 MED ORDER — PAROXETINE HCL 10 MG PO TABS: 10.0000 mg | ORAL_TABLET | Freq: Every day | ORAL | 3 refills | Status: DC

## 2019-12-24 NOTE — Progress Notes (Signed)
Acute Office Visit  Subjective:    Patient ID: Cole Rose, male    DOB: Sep 03, 1971, 49 y.o.   MRN: [id number]  Chief Complaint  Patient presents with  . Fatigue    losing weight, no appetite, insomnia, getting weaker    HPI Cole Rose is a 49 year old male presenting for feeling more tired than usual and feeling high anxiety. Over the course of a month the pt established care with this provider, was seen in ER for CT, developed acid reflux, a  Few office visits for worries and new sxs of abdomin bloating, has seen cardiology ruled out cardiac etiology, saw GI included EGD ruled out etiology of sxs, continued PPI with admitted decrease in acid and abdominal bloating, pending pulmonology this month for CT from ER order revealing mild bll atelectasis to which inhalers where initiated and pt denies sob, difficulty breathing.   Today the pt is reporting sxs of difficulty sleeping, daytime tiredness, waking up worrying about his health, loss of appetite, loss of weight recently that makes him more anxious that something is wrong with him and that he is not well like maybe he has cancer. The pt does admit to worrying daily sometimes nearly all day about his health. He denied feeling down or depressed. He has tried no tx for the stated sxs. No h/o depression.   Pt does report that on occasion he has mid nipple discomfort that has no aggravating or alleviating factors. Denied other associated sxs such as n/v, pain, arm pain, tooth ache, diaphoris. He denied having today. No txs tried other than stated.  Pulmonary appt in 2 weeks for management and monitoring of bll atelectasis.   Past Medical History:  Diagnosis Date  . Anxiety   . Bilateral atelectasis 12/05/2019  . Gastroesophageal reflux disease 12/11/2019  . History of right inguinal hernia repair 11/27/2019    Past Surgical History:  Procedure Laterality Date  . HERNIA REPAIR Right    inguinal    No family history on  file.  Social History   Socioeconomic History  . Marital status: Married    Spouse name: Not on file  . Number of children: Not on file  . Years of education: Not on file  . Highest education level: Not on file  Occupational History  . Not on file  Tobacco Use  . Smoking status: Former Games developer  . Smokeless tobacco: Never Used  Substance and Sexual Activity  . Alcohol use: Yes    Alcohol/week: 7.0 standard drinks    Types: 7 Standard drinks or equivalent per week    Comment:  a week, vodka tonic mixed drinks  . Drug use: Not on file  . Sexual activity: Not on file  Other Topics Concern  . Not on file  Social History Narrative  . Not on file   Social Determinants of Health   Financial Resource Strain:   . Difficulty of Paying Living Expenses:   Food Insecurity:   . Worried About Programme researcher, broadcasting/film/video in the Last Year:   . Barista in the Last Year:   Transportation Needs:   . Freight forwarder (Medical):   Marland Kitchen Lack of Transportation (Non-Medical):   Physical Activity:   . Days of Exercise per Week:   . Minutes of Exercise per Session:   Stress:   . Feeling of Stress :   Social Connections:   . Frequency of Communication with Friends and Family:   .  Frequency of Social Gatherings with Friends and Family:   . Attends Religious Services:   . Active Member of Clubs or Organizations:   . Attends Archivist Meetings:   Marland Kitchen Marital Status:   Intimate Partner Violence:   . Fear of Current or Ex-Partner:   . Emotionally Abused:   Marland Kitchen Physically Abused:   . Sexually Abused:     Outpatient Medications Prior to Visit  Medication Sig Dispense Refill  . acetaminophen (TYLENOL) 500 MG tablet Take 1,000 mg by mouth every 6 (six) hours as needed for headache.     . albuterol (VENTOLIN HFA) 108 (90 Base) MCG/ACT inhaler Inhale 2 puffs into the lungs every 6 (six) hours as needed for wheezing or shortness of breath. 8 g 2  . ALPRAZolam (XANAX) 0.25 MG tablet Take 1  tablet (0.25 mg total) by mouth 2 (two) times daily as needed for anxiety. 20 tablet 0  . aspirin 81 MG chewable tablet Chew 324 mg by mouth once.     Marland Kitchen atorvastatin (LIPITOR) 80 MG tablet Take 1 tablet (80 mg total) by mouth daily at 6 PM. 30 tablet 3  . Fluticasone-Salmeterol (ADVAIR DISKUS) 250-50 MCG/DOSE AEPB Inhale 1 puff into the lungs 2 (two) times daily. 1 each 2  . losartan (COZAAR) 25 MG tablet Take 1 tablet (25 mg total) by mouth daily. 30 tablet 11  . Multiple Vitamin (MULTIVITAMIN WITH MINERALS) TABS tablet Take 1 tablet by mouth at bedtime.    . pantoprazole (PROTONIX) 40 MG tablet Take 1 tablet (40 mg total) by mouth daily. 30 tablet 3   No facility-administered medications prior to visit.    No Known Allergies  Review of Systems  All other systems reviewed and are negative.      Objective:    Physical Exam Vitals and nursing note reviewed.  Constitutional:      General: He is not in acute distress.    Appearance: Normal appearance. He is well-developed and well-groomed. He is not ill-appearing, toxic-appearing or diaphoretic.  HENT:     Head: Normocephalic.     Right Ear: Hearing and external ear normal.     Left Ear: Hearing and external ear normal.     Nose: Nose normal.     Mouth/Throat:     Lips: Pink.  Eyes:     General: Lids are normal. Lids are everted, no foreign bodies appreciated.  Cardiovascular:     Rate and Rhythm: Normal rate.  Pulmonary:     Effort: Pulmonary effort is normal.  Chest:     Chest wall: No deformity.  Musculoskeletal:        General: Normal range of motion.     Cervical back: Normal range of motion and neck supple.     Right lower leg: No edema.     Left lower leg: No edema.  Skin:    General: Skin is warm and dry.     Capillary Refill: Capillary refill takes less than 2 seconds.  Neurological:     General: No focal deficit present.     Mental Status: He is alert and oriented to person, place, and time.  Psychiatric:         Attention and Perception: Attention normal.        Mood and Affect: Mood is anxious. Mood is not depressed.        Speech: Speech normal.        Behavior: Behavior is cooperative.  Cognition and Memory: Cognition normal.        Judgment: Judgment normal.     BP 120/72 (BP Location: Left Arm, Patient Position: Sitting, Cuff Size: Normal)   Pulse 75   Temp 98.1 F (36.7 C) (Temporal)   Resp 18   Ht 5\' 11"  (1.803 m)   Wt 190 lb 12.8 oz (86.5 kg)   SpO2 98%   BMI 26.61 kg/m  Wt Readings from Last 3 Encounters:  12/24/19 190 lb 12.8 oz (86.5 kg)  12/12/19 197 lb 12.8 oz (89.7 kg)  11/29/19 198 lb 6.6 oz (90 kg)    Health Maintenance Due  Topic Date Due  . TETANUS/TDAP  Never done    There are no preventive care reminders to display for this patient.   No results found for: TSH Lab Results  Component Value Date   WBC 10.2 12/01/2019   HGB 14.5 12/01/2019   HCT 43.7 12/01/2019   MCV 88.5 12/01/2019   PLT 263 12/01/2019   Lab Results  Component Value Date   NA 139 12/01/2019   K 3.7 12/01/2019   CO2 27 12/01/2019   GLUCOSE 90 12/01/2019   BUN 14 12/01/2019   CREATININE 1.05 12/01/2019   BILITOT 0.6 11/29/2019   ALKPHOS 62 11/29/2019   AST 19 11/29/2019   ALT 34 11/29/2019   PROT 7.9 11/29/2019   ALBUMIN 4.4 11/29/2019   CALCIUM 9.3 12/01/2019   ANIONGAP 8 12/01/2019   Lab Results  Component Value Date   CHOL 224 (H) 11/27/2019   Lab Results  Component Value Date   HDL 57 11/27/2019   Lab Results  Component Value Date   LDLCALC 152 (H) 11/27/2019   Lab Results  Component Value Date   TRIG 62 11/27/2019   Lab Results  Component Value Date   CHOLHDL 3.9 11/27/2019   Lab Results  Component Value Date   HGBA1C 5.2 11/27/2019       Assessment & Plan:  Anxiety/ Depression: start taking the new SSRI prescription as directed and follow up in 6 weeks.  I believe that your sxs are situational as discussed.  Make an appointment with  cardiologist for midsternum discomfort. As discussed today if your sxs worsen, new sxs occur, non resolving sxs, difficulty breathing, cp/ct, nausea, pain, then you should go to the ER.  Keep appointment with pulmonology as planned for bil ll atelectasias that you have this month and continue the prescribed inhalers. For the mid nipple chest discomfort make appt with cardiology and if worsen or added sxs as discussed go to ER.  Problem List Items Addressed This Visit    None    Visit Diagnoses    Anxiety and depression    -  Primary   Relevant Medications   PARoxetine (PAXIL) 10 MG tablet       Meds ordered this encounter  Medications  . PARoxetine (PAXIL) 10 MG tablet    Sig: Take 1 tablet (10 mg total) by mouth daily.    Dispense:  30 tablet    Refill:  3     01/27/2020, FNP

## 2020-01-02 ENCOUNTER — Ambulatory Visit (HOSPITAL_COMMUNITY)
Admission: RE | Admit: 2020-01-02 | Discharge: 2020-01-02 | Disposition: A | Discharge status: Home / Self Care | Payer: 59 | Source: Ambulatory Visit | Attending: Nurse Practitioner | Admitting: Nurse Practitioner

## 2020-01-02 ENCOUNTER — Other Ambulatory Visit: Payer: Self-pay

## 2020-01-02 ENCOUNTER — Encounter: Payer: Self-pay | Admitting: Internal Medicine

## 2020-01-02 ENCOUNTER — Ambulatory Visit (INDEPENDENT_AMBULATORY_CARE_PROVIDER_SITE_OTHER): Payer: 59 | Admitting: Internal Medicine

## 2020-01-02 DIAGNOSIS — J9811 Atelectasis: Secondary | ICD-10-CM | POA: Diagnosis present

## 2020-01-02 DIAGNOSIS — R0600 Dyspnea, unspecified: Secondary | ICD-10-CM

## 2020-01-02 DIAGNOSIS — R0609 Other forms of dyspnea: Secondary | ICD-10-CM | POA: Insufficient documentation

## 2020-01-02 NOTE — Progress Notes (Addendum)
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 gerd > protonix no change >  To ER 11/29/19 > neg cardiac d/c and started  advair  w/u then endo and best when supine      History of Present Illness  01/02/2020  Pulmonary/ 1st office eval/Isamar Wellbrock  Chief Complaint  Patient presents with  . Pulmonary Consult    Referred by Ishmael Holter, NP for eval of atelectasis and SOB. Pt c/o SOB and CP x 6 wks. 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   Dyspnea:  More tired than sob / walked 30 min -40 min one day Cough: no cough/no choking  Sleep: paxil x 2 weeks better flat on back / sev pillows  SABA use: none but still on advair > no change  Midline pain always resolves supine and never wakes him up  No change in cp with meals  Cp seems to correlate more with sitting than ex  Wife recently change diet to more vegetables/ salads   No obvious other patterns in  day to day or daytime variability or assoc excess/ purulent sputum or mucus plugs or hemoptysis or chest tightness, subjective wheeze or overt sinus or hb symptoms.   Sleeping  without nocturnal  or early am exacerbation  of respiratory  c/o's or need for noct saba. Also denies any obvious fluctuation of symptoms with weather or environmental changes or other aggravating or alleviating factors except as outlined above   No unusual exposure hx or h/o childhood pna/ asthma or knowledge of premature birth.  Current Allergies, Complete Past Medical History, Past Surgical History, Family History, and Social History were reviewed in [employer]  record.  ROS  The following are not active complaints unless bolded Hoarseness, sore throat, dysphagia, dental problems, itching, sneezing,  nasal congestion or discharge of excess mucus or purulent secretions, ear ache,   fever, chills, sweats, unintended wt loss or wt gain, classically pleuritic or exertional cp,  orthopnea pnd or arm/hand swelling  or leg swelling, presyncope, palpitations, abdominal pain= bloating , anorexia, nausea, vomiting, diarrhea  or change in bowel habits or change in bladder habits, change in stools or change in urine, dysuria, hematuria,  rash, arthralgias, visual complaints, headache, numbness, weakness or ataxia or problems with walking or coordination,  change in mood= anxious or  memory.           .  Past Medical History:  Diagnosis Date  . Anxiety   . Bilateral atelectasis 12/05/2019  . Gastroesophageal reflux disease 12/11/2019  . History of right inguinal hernia repair 11/27/2019    Outpatient Medications Prior to Visit  Medication Sig Dispense Refill  . acetaminophen (TYLENOL) 500 MG tablet Take 1,000 mg by mouth every 6 (six) hours as needed for headache.     . albuterol (VENTOLIN HFA) 108 (90 Base) MCG/ACT inhaler Inhale 2 puffs into the lungs every 6 (six) hours as needed for wheezing or shortness of breath. Not using    . ALPRAZolam (XANAX) 0.25  MG tablet Take 1 tablet (0.25 mg total) by mouth 2 (two) times daily as needed for anxiety. 20 tablet 0  . aspirin 81 MG chewable tablet Chew 324 mg by mouth once.     Marland Kitchen atorvastatin (LIPITOR) 80 MG tablet Take 1 tablet (80 mg total) by mouth daily at 6 PM. 30 tablet 3  . Fluticasone-Salmeterol (ADVAIR DISKUS) 250-50 MCG/DOSE AEPB Inhale 1 puff into the lungs 2 (two) times daily. 1 each 2  . losartan (COZAAR) 25 MG tablet Take 1 tablet (25 mg total) by mouth daily. 30 tablet 11  . PARoxetine (PAXIL) 10 MG tablet Take 1 tablet (10 mg total) by mouth daily. 30 tablet 3  . Multiple Vitamin (MULTIVITAMIN WITH  MINERALS) TABS tablet Take 1 tablet by mouth at bedtime.    . pantoprazole (PROTONIX) 40 MG tablet Take 1 tablet (40 mg total) by mouth daily.  stopped 12/28/19           Objective:     BP 110/60 (BP Location: Left Arm, Cuff Size: Normal)   Pulse 60   Temp (!) 97.3 F (36.3 C) (Temporal)   Ht 5\' 11"  (1.803 m)   Wt 193 lb 9.6 oz (87.8 kg)   SpO2 100% Comment: on RA  BMI 27.00 kg/m   SpO2: 100 %(on RA)   Wt Readings from Last 3 Encounters:  01/02/20 193 lb 9.6 oz (87.8 kg)  12/24/19 190 lb 12.8 oz (86.5 kg)  12/12/19 197 lb 12.8 oz (89.7 kg)    Somber wm nad   HEENT : pt wearing mask not removed for exam due to covid -19 concerns.    NECK :  without JVD/Nodes/TM/ nl carotid upstrokes bilaterally   LUNGS: no acc muscle use,  Nl contour chest which is clear to A and P bilaterally without cough on insp or exp maneuvers   CV:  RRR  no s3 or murmur or increase in P2, and no edema   ABD:  soft and nontender with nl inspiratory excursion in the supine position. No bruits or organomegaly appreciated, bowel sounds nl  MS:  Nl gait/ ext warm without deformities, calf tenderness, cyanosis or clubbing No obvious joint restrictions   SKIN: warm and dry without lesions    NEURO:  alert, approp, nl sensorium with  no motor or cerebellar deficits apparent.     I personally reviewed images and  impression as follows:   Chest CT s contrast 01/02/2020  No significant findings/ atx has resolved       Assessment   No problem-specific Assessment & Plan notes found for this encounter.     01/04/2020, MD 01/02/2020

## 2020-01-02 NOTE — Assessment & Plan Note (Addendum)
Onset early March 2021 with sense of midline ant cp/bubble in chest/ burning  - no response to ppi > neg EGD by Elnoria Howard - Neg Ct heart x for min dep atx  - 01/02/2020   Walked RA x two laps =  approx 569ft @ fast  pace - stopped due to end of study  with sats of 96 % at the end of the study. - rx as IBS 01/02/2020   Classic subdiaphragmatic pain pattern suggests ibs:  Stereotypical, migratory with a very limited distribution of pain locations, daytime, not usually exacerbated by exercise  or coughing, worse in sitting position, frequently associated with generalized abd bloating, not as likely to be present supine due to the dome effect of the diaphragm which  is  canceled in that position. Frequently these patients have had multiple negative GI workups and CT scans.  Treatment consists of avoiding foods that cause gas (especially boiled eggs, mexcican food but especially  beans and undercooked vegetables like  spinach and some salads)  and citrucel 1 heaping tsp twice daily with a large glass of water.  Pain/doe  should improve w/in 2 weeks and if not then consider CPST to answer the issues of sob and perhaps causality of chest discomfort.            Each maintenance medication was reviewed in detail including emphasizing most importantly the difference between maintenance and prns and under what circumstances the prns are to be triggered using an action plan format where appropriate.  Total time for H and P, chart review, counseling, teaching device and generating customized AVS unique to this office visit / charting = 60 min

## 2020-01-02 NOTE — Patient Instructions (Signed)
Stop advair and ventolin   Start pepcid a 20 mg after bfast and supper x 2 weeks then stop this also   Classic subdiaphragmatic pain pattern suggests ibs:  Stereotypical, migratory with a very limited distribution of pain locations, daytime, not usually exacerbated by exercise  or coughing, worse in sitting position, frequently associated with generalized abd bloating, not as likely to be present supine due to the dome effect of the diaphragm which  is  canceled in that position. Frequently these patients have had multiple negative GI workups and CT scans.  Treatment consists of avoiding foods that cause gas (especially boiled eggs, mexcican food but especially  beans and undercooked vegetables like  spinach and some salads)  and citrucel 1 heaping tsp twice daily with a large glass of water.  Pain should resolved and breathing back to normal 2 weeks and if not then consider further work up such as a CPST.

## 2020-01-08 ENCOUNTER — Institutional Professional Consult (permissible substitution): Payer: PRIVATE HEALTH INSURANCE | Admitting: Pulmonary Disease

## 2020-01-08 ENCOUNTER — Telehealth: Payer: Self-pay | Admitting: Nurse Practitioner

## 2020-01-08 NOTE — Telephone Encounter (Signed)
Ask them to make apt with GI for yellowing of the eyes

## 2020-01-08 NOTE — Telephone Encounter (Signed)
#  [phone] Tobi Bastos (wife) caller. Chantz was seen by a pulmonary the doctor said everything is good. But he's still not feeling well wasn't sure if he  need to come in to redo blood work also discoloration in his eyes.

## 2020-01-09 NOTE — Telephone Encounter (Signed)
Pt notified Verbalizes understanding 

## 2020-01-20 ENCOUNTER — Encounter: Payer: Self-pay | Admitting: Nurse Practitioner

## 2020-01-20 ENCOUNTER — Other Ambulatory Visit: Payer: Self-pay

## 2020-01-20 ENCOUNTER — Ambulatory Visit (INDEPENDENT_AMBULATORY_CARE_PROVIDER_SITE_OTHER): Payer: 59 | Admitting: Nurse Practitioner

## 2020-01-20 VITALS — BP 110/74 | HR 53 | Temp 97.6°F | Resp 16 | Ht 71.0 in | Wt 197.4 lb

## 2020-01-20 DIAGNOSIS — G4700 Insomnia, unspecified: Secondary | ICD-10-CM

## 2020-01-20 DIAGNOSIS — F329 Major depressive disorder, single episode, unspecified: Secondary | ICD-10-CM

## 2020-01-20 DIAGNOSIS — F32A Depression, unspecified: Secondary | ICD-10-CM

## 2020-01-20 DIAGNOSIS — F419 Anxiety disorder, unspecified: Secondary | ICD-10-CM | POA: Diagnosis not present

## 2020-01-20 HISTORY — DX: Major depressive disorder, single episode, unspecified: F32.9

## 2020-01-20 HISTORY — DX: Depression, unspecified: F32.A

## 2020-01-20 HISTORY — DX: Anxiety disorder, unspecified: F41.9

## 2020-01-20 MED ORDER — PAROXETINE HCL 20 MG PO TABS: 20.0000 mg | ORAL_TABLET | Freq: Every day | ORAL | 2 refills | Status: AC

## 2020-01-20 NOTE — Progress Notes (Signed)
Established Patient Office Visit  Subjective:  Patient ID: Cole Rose, male    DOB: 1970/11/09  Age: 49 y.o. MRN: [id number]  CC:  Chief Complaint  Patient presents with  . Anxiety    HPI Cole Rose is a 49 year old male presenting for follow up depression/anxiety with insomnia r/t worry of his sxs/condition/ He was started on Paxil 10 mg approx. 4 weeks ago. At that time he reported difficulty sleeping, daytime tiredness, waking up worrying about his health, loss of appetite, loss of weight recently that makes him more anxious that something is wrong with him and that he is not well like maybe he has cancer. The pt does admit to worrying daily sometimes nearly all day about his health.  Today he reports his daytime sxs nearly resolved and sleeping better. He does admit at around 1 AM he wakes and cannot sleep until 3 AM. He denied known worry during that time. He is agreeable to increasing medication and following up in 4-6 weeks. If he continues to have insomnia with added use of Melatonin will complete sleep study.  No cp/ct, gi/gu sxs, pain, sob, edema. No thoughts of harm to self, others, or suicidal ideations.  Past Medical History:  Diagnosis Date  . Anxiety   . Bilateral atelectasis 12/05/2019  . Gastroesophageal reflux disease 12/11/2019  . History of right inguinal hernia repair 11/27/2019    Past Surgical History:  Procedure Laterality Date  . HERNIA REPAIR Right    inguinal    No family history on file.  Social History   Socioeconomic History  . Marital status: Married    Spouse name: Not on file  . Number of children: Not on file  . Years of education: Not on file  . Highest education level: Not on file  Occupational History  . Not on file  Tobacco Use  . Smoking status: Former Smoker    Packs/day: 1.50    Years: 6.00    Pack years: 9.00    Quit date: 09/19/1994    Years since quitting: 25.3  . Smokeless tobacco: Never Used  Substance and  Sexual Activity  . Alcohol use: Yes    Alcohol/week: 7.0 standard drinks    Types: 7 Standard drinks or equivalent per week    Comment:  a week, vodka tonic mixed drinks  . Drug use: Not on file  . Sexual activity: Not on file  Other Topics Concern  . Not on file  Social History Narrative  . Not on file   Social Determinants of Health   Financial Resource Strain:   . Difficulty of Paying Living Expenses:   Food Insecurity:   . Worried About Programme researcher, broadcasting/film/video in the Last Year:   . Barista in the Last Year:   Transportation Needs:   . Freight forwarder (Medical):   Marland Kitchen Lack of Transportation (Non-Medical):   Physical Activity:   . Days of Exercise per Week:   . Minutes of Exercise per Session:   Stress:   . Feeling of Stress :   Social Connections:   . Frequency of Communication with Friends and Family:   . Frequency of Social Gatherings with Friends and Family:   . Attends Religious Services:   . Active Member of Clubs or Organizations:   . Attends Banker Meetings:   Marland Kitchen Marital Status:   Intimate Partner Violence:   . Fear of Current or Ex-Partner:   .  Emotionally Abused:   Marland Kitchen Physically Abused:   . Sexually Abused:     Outpatient Medications Prior to Visit  Medication Sig Dispense Refill  . ALPRAZolam (XANAX) 0.25 MG tablet Take 1 tablet (0.25 mg total) by mouth 2 (two) times daily as needed for anxiety. 20 tablet 0  . aspirin 81 MG chewable tablet Chew 324 mg by mouth once.     Marland Kitchen atorvastatin (LIPITOR) 80 MG tablet Take 1 tablet (80 mg total) by mouth daily at 6 PM. 30 tablet 3  . losartan (COZAAR) 25 MG tablet Take 1 tablet (25 mg total) by mouth daily. 30 tablet 11  . PARoxetine (PAXIL) 10 MG tablet Take 1 tablet (10 mg total) by mouth daily. 30 tablet 3  . acetaminophen (TYLENOL) 500 MG tablet Take 1,000 mg by mouth every 6 (six) hours as needed for headache.      No facility-administered medications prior to visit.    No Known  Allergies  ROS Review of Systems  All other systems reviewed and are negative.     Objective:    Physical Exam  Constitutional: He appears well-developed and well-nourished.  HENT:  Head: Normocephalic.  Eyes: Pupils are equal, round, and reactive to light. Conjunctivae and EOM are normal. No scleral icterus.  Neck: No JVD present.  Cardiovascular: Normal rate.  Pulmonary/Chest: Effort normal.  Musculoskeletal:        General: Normal range of motion.     Cervical back: Normal range of motion.  Psychiatric: He has a normal mood and affect. His speech is normal and behavior is normal. Judgment and thought content normal. His mood appears not anxious. His affect is not angry and not blunt. Cognition and memory are normal. He does not exhibit a depressed mood.  Nursing note and vitals reviewed.   BP 110/74   Pulse (!) 53   Temp 97.6 F (36.4 C) (Temporal)   Resp 16   Ht 5\' 11"  (1.803 m)   Wt 197 lb 6 oz (89.5 kg)   SpO2 97%   BMI 27.53 kg/m  Wt Readings from Last 3 Encounters:  01/20/20 197 lb 6 oz (89.5 kg)  01/02/20 193 lb 9.6 oz (87.8 kg)  12/24/19 190 lb 12.8 oz (86.5 kg)     Health Maintenance Due  Topic Date Due  . COVID-19 Vaccine (1) Never done  . TETANUS/TDAP  Never done    There are no preventive care reminders to display for this patient.  No results found for: TSH Lab Results  Component Value Date   WBC 10.2 12/01/2019   HGB 14.5 12/01/2019   HCT 43.7 12/01/2019   MCV 88.5 12/01/2019   PLT 263 12/01/2019   Lab Results  Component Value Date   NA 139 12/01/2019   K 3.7 12/01/2019   CO2 27 12/01/2019   GLUCOSE 90 12/01/2019   BUN 14 12/01/2019   CREATININE 1.05 12/01/2019   BILITOT 0.6 11/29/2019   ALKPHOS 62 11/29/2019   AST 19 11/29/2019   ALT 34 11/29/2019   PROT 7.9 11/29/2019   ALBUMIN 4.4 11/29/2019   CALCIUM 9.3 12/01/2019   ANIONGAP 8 12/01/2019   Lab Results  Component Value Date   CHOL 224 (H) 11/27/2019   Lab Results    Component Value Date   HDL 57 11/27/2019   Lab Results  Component Value Date   LDLCALC 152 (H) 11/27/2019   Lab Results  Component Value Date   TRIG 62 11/27/2019   Lab Results  Component Value Date   CHOLHDL 3.9 11/27/2019   Lab Results  Component Value Date   HGBA1C 5.2 11/27/2019      Assessment & Plan:  For depression/anxiety will increase Paxil to 20 mg daily and follow up in 4-6 weeks.  For insomnia will add melatonin OTC and plan sleep study at next follow up if sxs not resolved.  Problem List Items Addressed This Visit    None    Visit Diagnoses    Anxiety and depression    -  Primary   Relevant Medications   PARoxetine (PAXIL) 20 MG tablet   Insomnia, unspecified type       Relevant Medications   PARoxetine (PAXIL) 20 MG tablet      Meds ordered this encounter  Medications  . PARoxetine (PAXIL) 20 MG tablet    Sig: Take 1 tablet (20 mg total) by mouth daily.    Dispense:  30 tablet    Refill:  2    Follow-up: Return in about 6 weeks (around 03/02/2020).    Annie Main, FNP

## 2020-01-23 ENCOUNTER — Ambulatory Visit: Payer: 59 | Admitting: Nurse Practitioner

## 2020-02-25 ENCOUNTER — Other Ambulatory Visit: Payer: Self-pay

## 2020-02-25 ENCOUNTER — Ambulatory Visit (INDEPENDENT_AMBULATORY_CARE_PROVIDER_SITE_OTHER): Payer: 59 | Admitting: Nurse Practitioner

## 2020-02-25 VITALS — BP 110/72 | HR 54 | Temp 97.6°F | Resp 18 | Wt 203.2 lb

## 2020-02-25 DIAGNOSIS — F329 Major depressive disorder, single episode, unspecified: Secondary | ICD-10-CM | POA: Diagnosis not present

## 2020-02-25 DIAGNOSIS — F419 Anxiety disorder, unspecified: Secondary | ICD-10-CM | POA: Diagnosis not present

## 2020-02-25 DIAGNOSIS — F32A Depression, unspecified: Secondary | ICD-10-CM

## 2020-02-25 MED ORDER — ATORVASTATIN CALCIUM 20 MG PO TABS: 20.0000 mg | ORAL_TABLET | Freq: Every day | ORAL | 3 refills | Status: AC

## 2020-02-25 NOTE — Patient Instructions (Addendum)
Continue Prozac Follow up planned with labs for December Blood pressure goals 140/90 or less Consider Tdap vaccine

## 2020-02-25 NOTE — Progress Notes (Signed)
Established Patient Office Visit  Subjective:  Patient ID: Cole Rose, male    DOB: 1971/03/12  Age: 49 y.o. MRN: [id number]  CC:  Chief Complaint  Patient presents with  . Anxiety    pt states it is better    HPI Cole Rose is a 49 year old male presenting for follow-up of anxiety.  He describes Dyflex Arrien 10 mg 2 months ago he followed up after that point increase to 20 mg.  Today the patient reports that he is worried his anxiety has significantly decreased.  He has no longer experiencing insomnia.  His symptoms have nearly resolved that he was experiencing with his help as well.  He plans to continue taking the Prozac at 20 mg but did express that he thought he should discontinue taking the Prozac after 1 month.  The patient did not come appointment in September however after review of lab work and disease preventive screenings it was determined the patient will follow up in December.  No chest pain, chest tightness, GU/gi symptoms, pain, shortness of breath, edema, or falls  Patient reports that he completed the Covid vaccine in February and March, he has not completed hepatitis C but will complete in Dec.He is not sure in regards to his Tdap but would not like to have today he will consider and contact office for nursing visit if he decides to get the tetanus vaccination.  Past Medical History:  Diagnosis Date  . Anxiety   . Anxiety and depression 01/20/2020  . Bilateral atelectasis 12/05/2019  . Gastroesophageal reflux disease 12/11/2019  . History of right inguinal hernia repair 11/27/2019    Past Surgical History:  Procedure Laterality Date  . HERNIA REPAIR Right    inguinal    No family history on file.  Social History   Socioeconomic History  . Marital status: Married    Spouse name: Not on file  . Number of children: Not on file  . Years of education: Not on file  . Highest education level: Not on file  Occupational History  . Not on file    Tobacco Use  . Smoking status: Former Smoker    Packs/day: 1.50    Years: 6.00    Pack years: 9.00    Quit date: 09/19/1994    Years since quitting: 25.4  . Smokeless tobacco: Never Used  Substance and Sexual Activity  . Alcohol use: Yes    Alcohol/week: 7.0 standard drinks    Types: 7 Standard drinks or equivalent per week    Comment:  a week, vodka tonic mixed drinks  . Drug use: Not on file  . Sexual activity: Not on file  Other Topics Concern  . Not on file  Social History Narrative  . Not on file   Social Determinants of Health   Financial Resource Strain:   . Difficulty of Paying Living Expenses:   Food Insecurity:   . Worried About Charity fundraiser in the Last Year:   . Arboriculturist in the Last Year:   Transportation Needs:   . Film/video editor (Medical):   Marland Kitchen Lack of Transportation (Non-Medical):   Physical Activity:   . Days of Exercise per Week:   . Minutes of Exercise per Session:   Stress:   . Feeling of Stress :   Social Connections:   . Frequency of Communication with Friends and Family:   . Frequency of Social Gatherings with Friends and Family:   . Attends  Religious Services:   . Active Member of Clubs or Organizations:   . Attends Banker Meetings:   Marland Kitchen Marital Status:   Intimate Partner Violence:   . Fear of Current or Ex-Partner:   . Emotionally Abused:   Marland Kitchen Physically Abused:   . Sexually Abused:     Outpatient Medications Prior to Visit  Medication Sig Dispense Refill  . acetaminophen (TYLENOL) 500 MG tablet Take 1,000 mg by mouth every 6 (six) hours as needed for headache.     Marland Kitchen aspirin 81 MG chewable tablet Chew 324 mg by mouth once.     Marland Kitchen losartan (COZAAR) 25 MG tablet Take 1 tablet (25 mg total) by mouth daily. 30 tablet 11  . PARoxetine (PAXIL) 20 MG tablet Take 1 tablet (20 mg total) by mouth daily. 30 tablet 2  . atorvastatin (LIPITOR) 20 MG tablet Take 20 mg by mouth daily.    Marland Kitchen atorvastatin (LIPITOR) 80 MG  tablet Take 1 tablet (80 mg total) by mouth daily at 6 PM. 30 tablet 3  . ALPRAZolam (XANAX) 0.25 MG tablet Take 1 tablet (0.25 mg total) by mouth 2 (two) times daily as needed for anxiety. 20 tablet 0   No facility-administered medications prior to visit.    No Known Allergies  ROS Review of Systems  All other systems reviewed and are negative.     Objective:    Physical Exam  Constitutional: He is oriented to person, place, and time. He appears well-developed and well-nourished. He is cooperative.  HENT:  Head: Normocephalic.  Right Ear: Hearing normal.  Left Ear: Hearing normal.  Eyes: Pupils are equal, round, and reactive to light. EOM and lids are normal. Lids are everted and swept, no foreign bodies found.  Neck: No JVD present.  Cardiovascular: Normal rate.  Pulmonary/Chest: Effort normal.  Abdominal: Normal appearance.  Musculoskeletal:        General: Normal range of motion.     Cervical back: Normal range of motion and neck supple.  Neurological: He is alert and oriented to person, place, and time.  Skin: Skin is warm and dry.  Psychiatric: He has a normal mood and affect. His speech is normal and behavior is normal. Judgment and thought content normal. Cognition and memory are normal.  Nursing note and vitals reviewed.   BP 110/72 (BP Location: Left Arm, Patient Position: Sitting, Cuff Size: Normal)   Pulse (!) 54   Temp 97.6 F (36.4 C) (Temporal)   Resp 18   Wt 203 lb 3.2 oz (92.2 kg)   SpO2 96%   BMI 28.34 kg/m  Wt Readings from Last 3 Encounters:  02/25/20 203 lb 3.2 oz (92.2 kg)  01/20/20 197 lb 6 oz (89.5 kg)  01/02/20 193 lb 9.6 oz (87.8 kg)     Health Maintenance Due  Topic Date Due  . COVID-19 Vaccine (1) Never done  . TETANUS/TDAP  Never done    There are no preventive care reminders to display for this patient.  No results found for: TSH Lab Results  Component Value Date   WBC 10.2 12/01/2019   HGB 14.5 12/01/2019   HCT 43.7  12/01/2019   MCV 88.5 12/01/2019   PLT 263 12/01/2019   Lab Results  Component Value Date   NA 139 12/01/2019   K 3.7 12/01/2019   CO2 27 12/01/2019   GLUCOSE 90 12/01/2019   BUN 14 12/01/2019   CREATININE 1.05 12/01/2019   BILITOT 0.6 11/29/2019   ALKPHOS 62  11/29/2019   AST 19 11/29/2019   ALT 34 11/29/2019   PROT 7.9 11/29/2019   ALBUMIN 4.4 11/29/2019   CALCIUM 9.3 12/01/2019   ANIONGAP 8 12/01/2019   Lab Results  Component Value Date   CHOL 224 (H) 11/27/2019   Lab Results  Component Value Date   HDL 57 11/27/2019   Lab Results  Component Value Date   LDLCALC 152 (H) 11/27/2019   Lab Results  Component Value Date   TRIG 62 11/27/2019   Lab Results  Component Value Date   CHOLHDL 3.9 11/27/2019   Lab Results  Component Value Date   HGBA1C 5.2 11/27/2019      Assessment & Plan:   Problem List Items Addressed This Visit      Other   Anxiety and depression - Primary    Continue Paxil dose at 20 mg.    Follow up planned with labs for December Blood pressure goals 140/90 or less Consider Tdap vaccine  Meds ordered this encounter  Medications  . atorvastatin (LIPITOR) 20 MG tablet    Sig: Take 1 tablet (20 mg total) by mouth daily.    Dispense:  30 tablet    Refill:  3    Follow-up: Return for f/u apt with labs in Dec change from Sept.    Elmore Guise, FNP

## 2020-04-14 ENCOUNTER — Telehealth: Payer: Self-pay

## 2020-04-14 NOTE — Telephone Encounter (Signed)
Error

## 2020-04-21 ENCOUNTER — Other Ambulatory Visit: Payer: Self-pay | Admitting: Nurse Practitioner

## 2020-04-21 DIAGNOSIS — Z1211 Encounter for screening for malignant neoplasm of colon: Secondary | ICD-10-CM

## 2020-04-28 ENCOUNTER — Other Ambulatory Visit: Payer: Self-pay | Admitting: Gastroenterology

## 2020-04-28 DIAGNOSIS — R1013 Epigastric pain: Secondary | ICD-10-CM

## 2020-05-07 ENCOUNTER — Other Ambulatory Visit: Payer: PRIVATE HEALTH INSURANCE

## 2020-05-08 ENCOUNTER — Ambulatory Visit
Admission: RE | Admit: 2020-05-08 | Discharge: 2020-05-08 | Disposition: A | Discharge status: Home / Self Care | Payer: PRIVATE HEALTH INSURANCE | Source: Ambulatory Visit | Attending: Gastroenterology | Admitting: Gastroenterology

## 2020-05-08 DIAGNOSIS — R1013 Epigastric pain: Secondary | ICD-10-CM

## 2020-05-08 MED ORDER — IOPAMIDOL (ISOVUE-300) INJECTION 61%
100.0000 mL | Freq: Once | INTRAVENOUS | Status: AC | PRN
  Administered 2020-05-08: 100 mL via INTRAVENOUS

## 2020-06-01 ENCOUNTER — Encounter: Payer: 59 | Admitting: Nurse Practitioner

## 2020-09-01 ENCOUNTER — Encounter: Payer: 59 | Admitting: Nurse Practitioner

## 2021-01-14 ENCOUNTER — Other Ambulatory Visit: Payer: Self-pay
